# Patient Record
Sex: Female | Born: 1935 | Race: White | Hispanic: No | Smoking: Former smoker
Health system: Southern US, Community
[De-identification: ages and names within clinical notes are randomized; demographics above are authoritative.]

## PROBLEM LIST (undated history)

## (undated) DIAGNOSIS — J189 Pneumonia, unspecified organism: Secondary | ICD-10-CM

## (undated) DIAGNOSIS — I10 Essential (primary) hypertension: Secondary | ICD-10-CM

## (undated) DIAGNOSIS — J439 Emphysema, unspecified: Secondary | ICD-10-CM

## (undated) HISTORY — PX: HERNIA REPAIR: SHX51

## (undated) HISTORY — PX: ABDOMINAL HYSTERECTOMY: SHX81

## (undated) HISTORY — PX: ELBOW SURGERY: SHX618

---

## 1997-10-25 ENCOUNTER — Ambulatory Visit (HOSPITAL_COMMUNITY): Admission: RE | Admit: 1997-10-25 | Discharge: 1997-10-25 | Payer: Self-pay | Admitting: Internal Medicine

## 1998-11-09 ENCOUNTER — Encounter: Payer: Self-pay | Admitting: Internal Medicine

## 1998-11-09 ENCOUNTER — Ambulatory Visit (HOSPITAL_COMMUNITY): Admission: RE | Admit: 1998-11-09 | Discharge: 1998-11-09 | Payer: Self-pay | Admitting: Internal Medicine

## 1999-12-16 ENCOUNTER — Encounter: Payer: Self-pay | Admitting: Internal Medicine

## 1999-12-16 ENCOUNTER — Ambulatory Visit (HOSPITAL_COMMUNITY): Admission: RE | Admit: 1999-12-16 | Discharge: 1999-12-16 | Payer: Self-pay | Admitting: Internal Medicine

## 2001-02-04 ENCOUNTER — Encounter: Payer: Self-pay | Admitting: Internal Medicine

## 2001-02-04 ENCOUNTER — Ambulatory Visit (HOSPITAL_COMMUNITY): Admission: RE | Admit: 2001-02-04 | Discharge: 2001-02-04 | Payer: Self-pay | Admitting: Internal Medicine

## 2001-12-30 ENCOUNTER — Ambulatory Visit (HOSPITAL_COMMUNITY): Admission: RE | Admit: 2001-12-30 | Discharge: 2001-12-30 | Payer: Self-pay | Admitting: General Surgery

## 2002-03-17 ENCOUNTER — Encounter: Payer: Self-pay | Admitting: Internal Medicine

## 2002-03-17 ENCOUNTER — Ambulatory Visit (HOSPITAL_COMMUNITY): Admission: RE | Admit: 2002-03-17 | Discharge: 2002-03-17 | Payer: Self-pay | Admitting: Internal Medicine

## 2002-06-22 ENCOUNTER — Encounter: Admission: RE | Admit: 2002-06-22 | Discharge: 2002-08-09 | Payer: Self-pay | Admitting: *Deleted

## 2002-09-30 ENCOUNTER — Ambulatory Visit (HOSPITAL_COMMUNITY): Admission: RE | Admit: 2002-09-30 | Discharge: 2002-09-30 | Payer: Self-pay | Admitting: General Surgery

## 2002-09-30 ENCOUNTER — Encounter: Payer: Self-pay | Admitting: General Surgery

## 2002-11-14 ENCOUNTER — Ambulatory Visit (HOSPITAL_COMMUNITY): Admission: RE | Admit: 2002-11-14 | Discharge: 2002-11-14 | Payer: Self-pay | Admitting: Internal Medicine

## 2002-11-14 ENCOUNTER — Encounter: Payer: Self-pay | Admitting: Internal Medicine

## 2003-03-02 ENCOUNTER — Encounter: Admission: RE | Admit: 2003-03-02 | Discharge: 2003-04-06 | Payer: Self-pay | Admitting: *Deleted

## 2003-06-21 ENCOUNTER — Ambulatory Visit (HOSPITAL_COMMUNITY): Admission: RE | Admit: 2003-06-21 | Discharge: 2003-06-21 | Payer: Self-pay | Admitting: Internal Medicine

## 2004-05-03 ENCOUNTER — Ambulatory Visit (HOSPITAL_COMMUNITY): Admission: RE | Admit: 2004-05-03 | Discharge: 2004-05-03 | Payer: Self-pay | Admitting: Internal Medicine

## 2004-05-15 ENCOUNTER — Ambulatory Visit (HOSPITAL_COMMUNITY): Admission: RE | Admit: 2004-05-15 | Discharge: 2004-05-15 | Payer: Self-pay | Admitting: Internal Medicine

## 2004-06-04 ENCOUNTER — Encounter: Admission: RE | Admit: 2004-06-04 | Discharge: 2004-06-04 | Payer: Self-pay | Admitting: Internal Medicine

## 2004-08-07 ENCOUNTER — Encounter: Admission: RE | Admit: 2004-08-07 | Discharge: 2004-08-07 | Payer: Self-pay | Admitting: Internal Medicine

## 2005-09-02 ENCOUNTER — Ambulatory Visit (HOSPITAL_COMMUNITY): Admission: RE | Admit: 2005-09-02 | Discharge: 2005-09-02 | Payer: Self-pay | Admitting: Internal Medicine

## 2006-09-26 IMAGING — CR DG CHEST 2V
2 series · 2 of 2 positions shown · non-contrast
Comparison: Report dated 11/14/2002.

CLINICAL DATA: History of smoking, COPD and asthma. Wheezing.

CHEST - 2 VIEW

[view not recorded (1 of 2)]
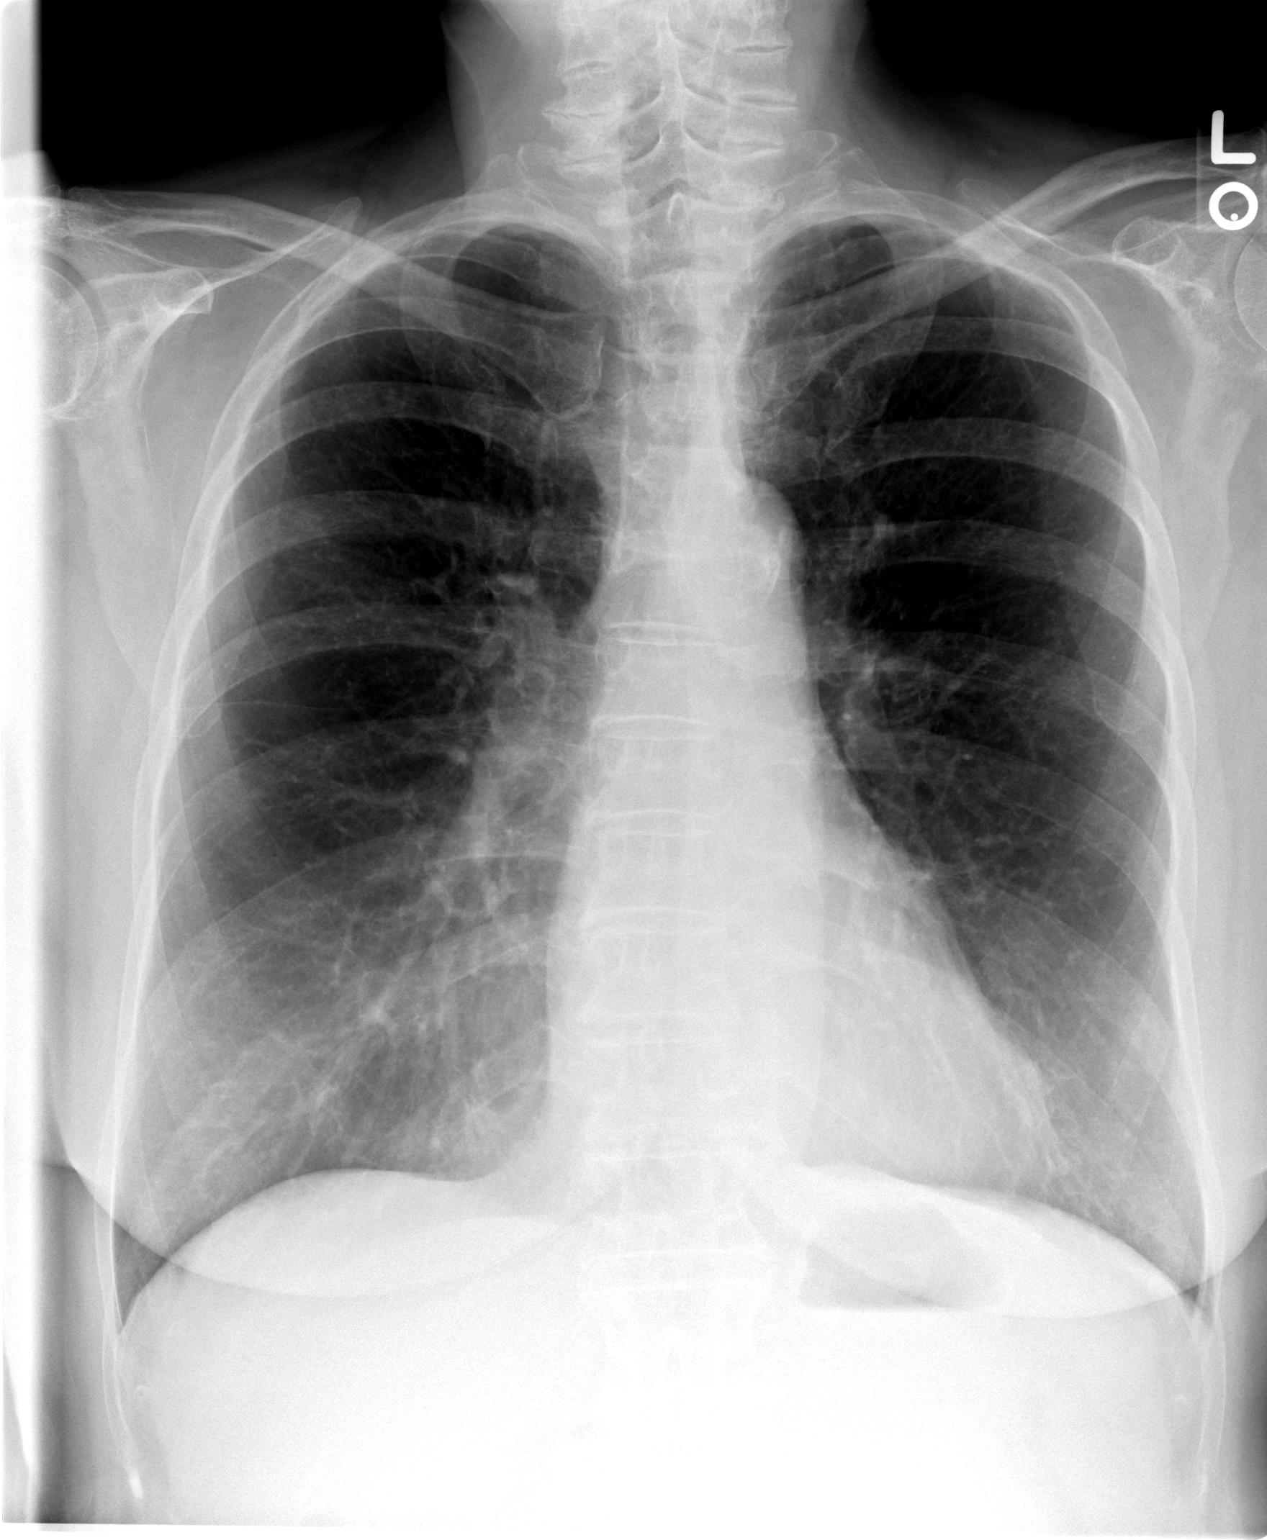

[view not recorded (2 of 2)]
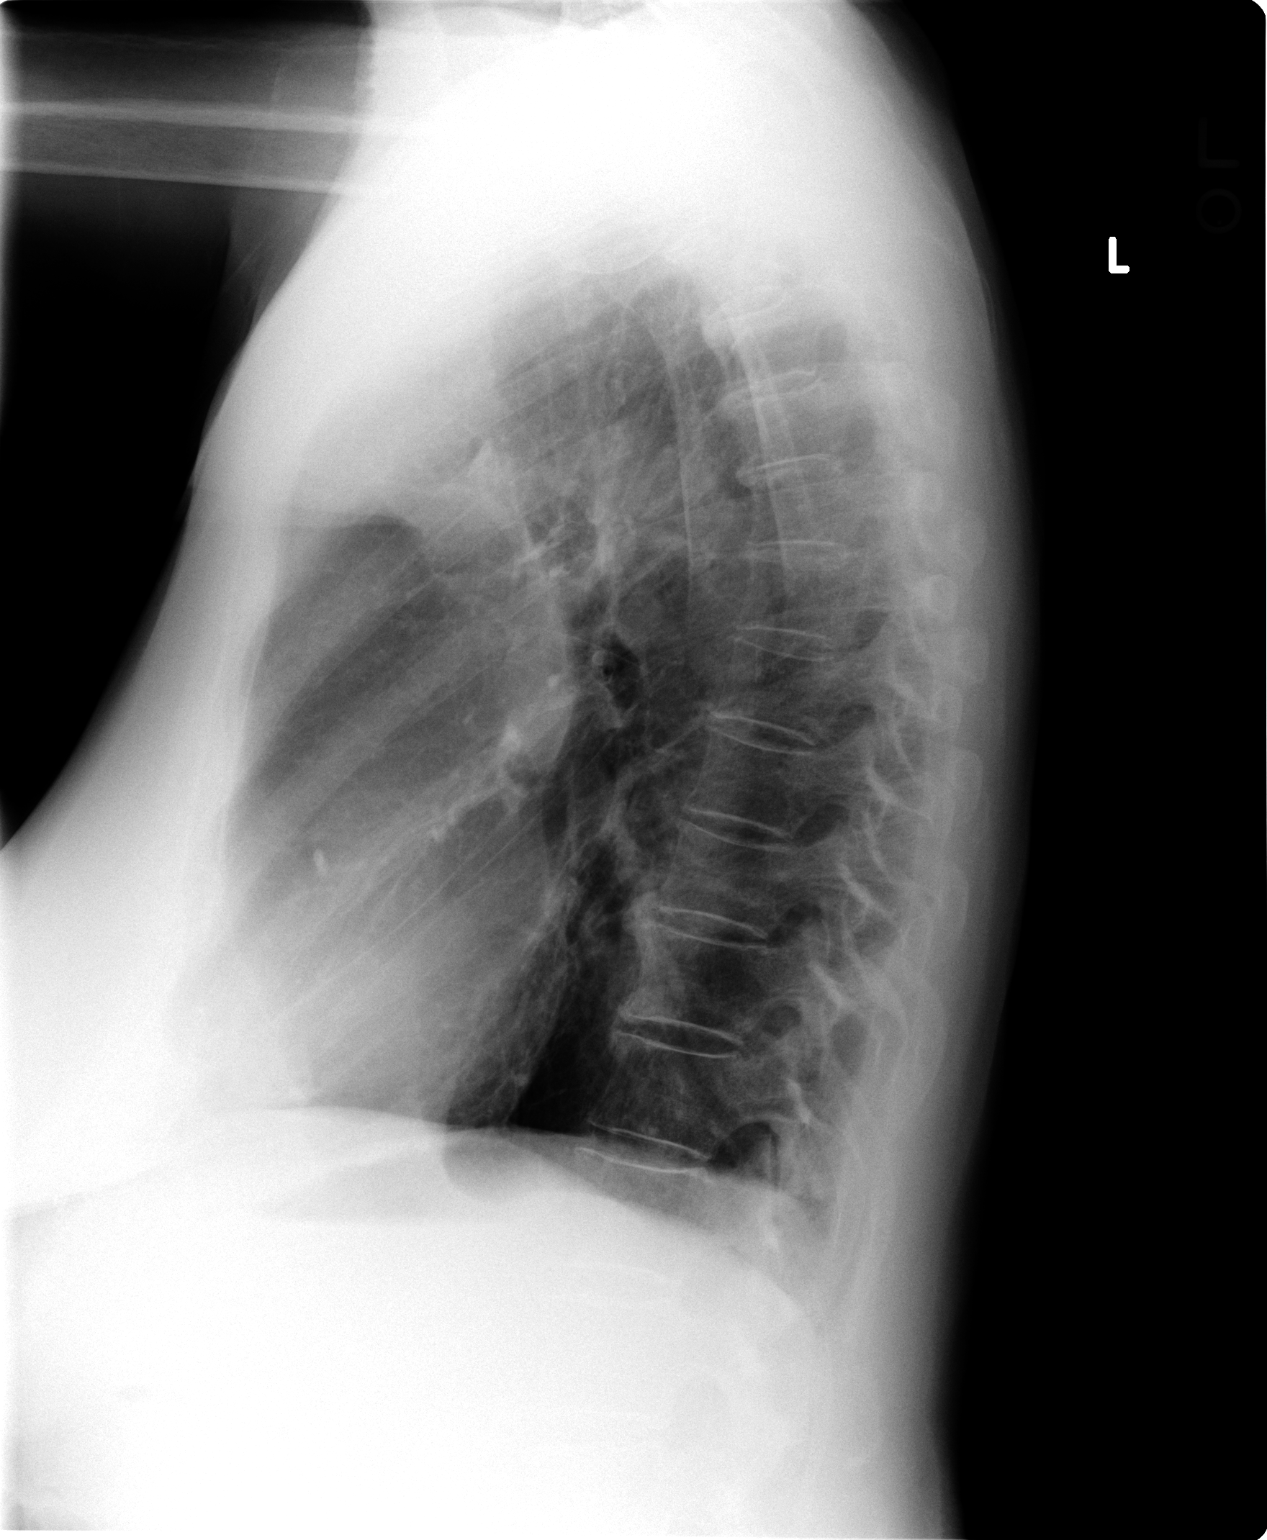

[2 of 2 positions shown; findings below may reference images not displayed]

FINDINGS: Normal sized heart. Mild to moderate changes of COPD. Mild scoliosis
and mild thoracic spine degenerative changes. Diffuse osteopenia.

IMPRESSION

Mild to moderate changes of COPD.

## 2006-10-14 ENCOUNTER — Ambulatory Visit (HOSPITAL_COMMUNITY): Admission: RE | Admit: 2006-10-14 | Discharge: 2006-10-14 | Payer: Self-pay | Admitting: Internal Medicine

## 2007-12-21 ENCOUNTER — Ambulatory Visit (HOSPITAL_COMMUNITY): Admission: RE | Admit: 2007-12-21 | Discharge: 2007-12-21 | Payer: Self-pay | Admitting: Internal Medicine

## 2007-12-30 ENCOUNTER — Encounter: Admission: RE | Admit: 2007-12-30 | Discharge: 2007-12-30 | Payer: Self-pay | Admitting: Internal Medicine

## 2009-02-13 ENCOUNTER — Encounter: Admission: RE | Admit: 2009-02-13 | Discharge: 2009-02-13 | Payer: Self-pay | Admitting: Internal Medicine

## 2009-06-06 ENCOUNTER — Encounter: Admission: RE | Admit: 2009-06-06 | Discharge: 2009-06-06 | Payer: Self-pay | Admitting: Internal Medicine

## 2009-08-02 ENCOUNTER — Encounter: Payer: Self-pay | Admitting: Critical Care Medicine

## 2009-10-01 DIAGNOSIS — E785 Hyperlipidemia, unspecified: Secondary | ICD-10-CM | POA: Insufficient documentation

## 2009-10-01 DIAGNOSIS — I872 Venous insufficiency (chronic) (peripheral): Secondary | ICD-10-CM | POA: Insufficient documentation

## 2009-10-01 DIAGNOSIS — J449 Chronic obstructive pulmonary disease, unspecified: Secondary | ICD-10-CM

## 2009-10-01 DIAGNOSIS — Z87898 Personal history of other specified conditions: Secondary | ICD-10-CM | POA: Insufficient documentation

## 2009-10-01 DIAGNOSIS — I10 Essential (primary) hypertension: Secondary | ICD-10-CM | POA: Insufficient documentation

## 2009-10-02 ENCOUNTER — Telehealth (INDEPENDENT_AMBULATORY_CARE_PROVIDER_SITE_OTHER): Payer: Self-pay | Admitting: *Deleted

## 2009-10-02 ENCOUNTER — Ambulatory Visit: Payer: Self-pay | Admitting: Critical Care Medicine

## 2009-10-02 DIAGNOSIS — G250 Essential tremor: Secondary | ICD-10-CM

## 2009-10-02 DIAGNOSIS — G252 Other specified forms of tremor: Secondary | ICD-10-CM

## 2009-10-04 ENCOUNTER — Encounter: Payer: Self-pay | Admitting: Critical Care Medicine

## 2009-10-16 ENCOUNTER — Encounter: Payer: Self-pay | Admitting: Critical Care Medicine

## 2009-10-26 ENCOUNTER — Telehealth (INDEPENDENT_AMBULATORY_CARE_PROVIDER_SITE_OTHER): Payer: Self-pay | Admitting: *Deleted

## 2009-10-31 ENCOUNTER — Encounter: Payer: Self-pay | Admitting: Critical Care Medicine

## 2009-11-06 ENCOUNTER — Encounter: Payer: Self-pay | Admitting: Critical Care Medicine

## 2009-12-05 ENCOUNTER — Ambulatory Visit: Payer: Self-pay | Admitting: Critical Care Medicine

## 2009-12-06 ENCOUNTER — Telehealth: Payer: Self-pay | Admitting: Critical Care Medicine

## 2009-12-20 ENCOUNTER — Telehealth (INDEPENDENT_AMBULATORY_CARE_PROVIDER_SITE_OTHER): Payer: Self-pay | Admitting: *Deleted

## 2009-12-20 ENCOUNTER — Encounter: Payer: Self-pay | Admitting: Critical Care Medicine

## 2009-12-21 ENCOUNTER — Telehealth (INDEPENDENT_AMBULATORY_CARE_PROVIDER_SITE_OTHER): Payer: Self-pay | Admitting: *Deleted

## 2010-06-09 ENCOUNTER — Encounter: Payer: Self-pay | Admitting: Internal Medicine

## 2010-06-20 NOTE — Miscellaneous (Signed)
Summary: ONO on RA  Clinical Lists Changes  Observations: Added new observation of SLEEP STUDY: Low Oxygen Sat: 83% Oximetry overnight on          RA        =   94% ,  most of sats >88%. suspect desats are artifacts  (12/11/2009 14:35)      Sleep Study  Procedure date:  12/11/2009  Findings:      Low Oxygen Sat: 83% Oximetry overnight on          RA        =   94% ,  most of sats >88%. suspect desats are artifacts

## 2010-06-20 NOTE — Assessment & Plan Note (Signed)
Summary: Pulmonary OV   Copy to:  Dr. Lucky [name] Primary Provider/Referring Provider:  Dr. Lucky [name]  CC:  Follow up to discuss oxygen.  Pt states she believes o2 was not helping.  States she is waking up much earlier and not sleeping as well with the oxygen.  Pt states overall breathing is doing better during the day.  States she "seldom" has a cough and does c/o hoarseness.  Denies wheezing and chest tightness.  Pt has moved to [place] [employer] [facility] since last OV.Marland Kitchen  History of Present Illness: Pulmonary OV       This is an 77 year old woman with COPD Golds II This pt is on atrovent and budesonide in nebulizer two times a day.   Also on spiriva and qvar two times a day. [facility] is home DME co. Pt has not been through pulm rehab.   Pt notes side effects with albuterol neb med.   Copd dx 2005,  no smoking since 2005.  This pt is dypsneic with exertion.  Notes lack of energy or endurance.  Had prior  been on azmacort/spiriva.  She has daily cough.   Pt clears throat alot and notes some sore throat, she is on an ACE inhiibitor   December 05, 2009 12:14 PM Pt is hoarse on qvar and spiriva.  Pt also not sure oxygen has been helpful.   Pt denies any significant sore throat, nasal congestion or excess secretions, fever, chills, sweats, unintended weight loss, pleurtic or exertional chest pain, orthopnea PND, or leg swelling Pt denies any increase in rescue therapy over baseline, denies waking up needing it or having any early am or nocturnal exacerbations of coughing/wheezing/or dyspnea. Pt queries if will need oxygen.  Preventive Screening-Counseling & Management  Alcohol-Tobacco     Smoking Status: quit     Packs/Day: <0.25     Year Quit: 2005     Pack years: 69  Current Medications (verified): 1)  Pravastatin Sodium 40 Mg Tabs (Pravastatin Sodium) .... 1/4 of Tab Once Daily 2)  Hydrochlorothiazide 25 Mg Tabs (Hydrochlorothiazide) .... Once Daily 3)  Benicar 20 Mg  Tabs (Olmesartan  Medoxomil) .... One Tablet By Mouth Daily 4)  Aspir-Low 81 Mg Tbec (Aspirin) .... 3-4 Times Weekly 5)  Guaifenesin 400 Mg Tabs (Guaifenesin) .... As Needed 6)  Vitamin D 1000 Unit Tabs (Cholecalciferol) .... 2 Tabs Once Daily 7)  Lutein 6 Mg Caps (Lutein) .... 2 Caps Once Daily 8)  Qvar 40 Mcg/act  Aers (Beclomethasone Dipropionate) .... Two Puffs Twice Daily 9)  Spiriva Handihaler 18 Mcg Caps (Tiotropium Bromide Monohydrate) .... Once Daily 10)  Xalatan 0.005 % Soln (Latanoprost) .Marland Kitchen.. 1 Gtt At Bedtime Right Eye 11)  Alprazolam 0.5 Mg Tbdp (Alprazolam) .... One By Mouth Qhs 12)  Magnesium 500 Mg Tabs (Magnesium) .... 2-3 Times Weekly 13)  Brovana 15 Mcg/58ml  Nebu (Arformoterol Tartrate) .... One in Nebulizer Twice Daily 14)  Complete Womens  Tabs (Multiple Vitamins-Minerals) .... Take 1 Tablet By Mouth Once A Day  Allergies (verified): 1)  ! * Zebeta 2)  Erythromycin 3)  Verapamil 4)  Pcn 5)  Cipro 6)  * Shellfish  Past History:  Past medical, surgical, family and social histories (including risk factors) reviewed, and no changes noted (except as noted below).  Past Medical History: Reviewed history from 10/02/2009 and no changes required. TREMOR, ESSENTIAL (ICD-333.1) ASTHMA (ICD-493.90) POSITIVE PPD (ICD-795.5) FIBROCYSTIC BREAST DISEASE, HX OF (ICD-V13.9) DYSLIPIDEMIA (ICD-272.4) SHINGLES, HX OF (ICD-V13.8) COPD (ICD-496) VENOUS INSUFFICIENCY (ICD-459.81)  HYPERTENSION (ICD-401.9)  Past Surgical History: Reviewed history from 10/01/2009 and no changes required. Appendectomy 1947 D&C 1952 hysterectomy and left oophorectomy 1991 repair of right inguinal hernia and right femoral hernia 2003  Family History: Reviewed history from 10/02/2009 and no changes required. Father-colon ca, MI, rheumatism mother-stroke brother-prostate ca sister-abdominal ca  Social History: Reviewed history from 10/02/2009 and no changes required. 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  Quit in late 1990's.  1/2ppd x 39yrs  Review of Systems       The patient complains of shortness of breath with activity and non-productive cough.  The patient denies shortness of breath at rest, productive cough, coughing up blood, chest pain, irregular heartbeats, acid heartburn, indigestion, loss of appetite, weight change, abdominal pain, difficulty swallowing, sore throat, tooth/dental problems, headaches, nasal congestion/difficulty breathing through nose, sneezing, itching, ear ache, anxiety, depression, hand/feet swelling, joint stiffness or pain, rash, change in color of mucus, and fever.    Vital Signs:  Patient profile:   77 year old female Height:      65.5 inches Weight:      148.25 pounds BMI:     24.38 O2 Sat:      97 % on Room air Temp:     98.0 degrees F oral Pulse rate:   70 / minute BP sitting:   126 / 78  (right arm) Cuff size:   regular  Vitals Entered By: Tonya Dimitri RN (December 05, 2009 12:09 PM)  O2 Flow:  Room air  Clinical Reports Reviewed:  PFT's:  12/05/2009: FEV1 %Predicted:  33.30 FEV1/FVC %Predicted:  72.80 FVC %Predicted:  46.40  10/02/2009: FEV1 %Predicted:  33.70 FEV1/FVC %Predicted:  63.90 FVC %Predicted:  53.50  CXR:  06/06/2009: CXR Results:  No active disease, COPD changes  CC: Follow up to discuss oxygen.  Pt states she believes o2 was not helping.  States she is waking up much earlier and not sleeping as well with the oxygen.  Pt states overall breathing is doing better during the day.  States she "seldom" has a cough and does c/o hoarseness.  Denies wheezing and chest tightness.  Pt has moved to [place] [employer] [facility] since last OV. Comments Medications reviewed with patient Daytime contact number verified with patient. Tonya Dimitri RN  December 05, 2009 12:04 PM    Physical Exam  General:  normal appearance.   Nose:  clear nasal discharge.   Mouth:  no  deformity or lesions Neck:  no masses, thyromegaly, or abnormal cervical nodes Chest Wall:  no deformities noted Lungs:  decreased BS bilateral and prolonged exhilation.   Heart:  regular rate and rhythm, S1, S2 without murmurs, rubs, gallops, or clicks Abdomen:  bowel sounds positive; abdomen soft and non-tender without masses, or organomegaly Msk:  no deformity or scoliosis noted with normal posture Pulses:  pulses normal Extremities:  no clubbing, cyanosis, edema, or deformity noted Neurologic:  CN II-XII grossly intact with normal reflexes, coordination, muscle strength and tone Skin:  intact without lesions or rashes Cervical Nodes:  no significant adenopathy Axillary Nodes:  no significant adenopathy Psych:  alert and cooperative; normal mood and affect; normal attention span and concentration   Pulmonary Function Test Date: 12/05/2009 12:33 AM Gender: Female  Pre-Spirometry FVC    Value: 1.19 L/min   % Pred: 46.40 % FEV1    Value: 0.63 L     Pred: 1.89 L     %  Pred: 33.30 % FEV1/FVC  Value: 53.12 %     % Pred: 72.80 %  Impression & Recommendations:  Problem # 1:  COPD (ICD-496)  Moderate COPD Golds stage III adverse side effect from ICS, doubt spiriva an issue. ? if oxygen beneficial plan stop qvar change benicar to generic losartan stay on spiriva and brovana ONO on RA repeat Note spirometry unchanged from prior  Medications Added to Medication List This Visit: 1)  Losartan Potassium 50 Mg Tabs (Losartan potassium) .... One by mouth daily 2)  Complete Womens Tabs (Multiple vitamins-minerals) .... Take 1 tablet by mouth once a day  Complete Medication List: 1)  Pravastatin Sodium 40 Mg Tabs (Pravastatin sodium) .... 1/4 of tab once daily 2)  Hydrochlorothiazide 25 Mg Tabs (Hydrochlorothiazide) .... Once daily 3)  Losartan Potassium 50 Mg Tabs (Losartan potassium) .... One by mouth daily 4)  Aspir-low 81 Mg Tbec (Aspirin) .... 3-4 times weekly 5)  Guaifenesin 400  Mg Tabs (Guaifenesin) .... As needed 6)  Vitamin D 1000 Unit Tabs (Cholecalciferol) .... 2 tabs once daily 7)  Lutein 6 Mg Caps (Lutein) .... 2 caps once daily 8)  Spiriva Handihaler 18 Mcg Caps (Tiotropium bromide monohydrate) .... Once daily 9)  Xalatan 0.005 % Soln (Latanoprost) .Marland Kitchen.. 1 gtt at bedtime right eye 10)  Alprazolam 0.5 Mg Tbdp (Alprazolam) .... One by mouth qhs 11)  Magnesium 500 Mg Tabs (Magnesium) .... 2-3 times weekly 12)  Brovana 15 Mcg/50ml Nebu (Arformoterol tartrate) .... One in nebulizer twice daily 13)  Complete Womens Tabs (Multiple vitamins-minerals) .... Take 1 tablet by mouth once a day  Other Orders: Est. Patient Level IV (16109) DME Referral (DME)  Patient Instructions: 1)  A repeat overnight sleep oximetry will be obtained on room air 2)  Switch from Benicar to losartan one daily 50mg  . see script 3)  Brovana refills were sent in May, stay on Brovana 4)  Stay on spiriva 5)  Stop Qvar 6)  Return as needed  Prescriptions: LOSARTAN POTASSIUM 50 MG TABS (LOSARTAN POTASSIUM) one by mouth daily  #30 x 6   Entered and Authorized by:   Storm Frisk MD   Signed by:   Storm Frisk MD on 12/05/2009   Method used:   Print then Give to Patient   RxID:   [id number]    CardioPerfect Spirometry  ID: [id number] Patient: Tonya [name] DOB: 1923-03-02 Age: 77 Years Old Sex: Female Race: White Physician: Delford Field Height: 65.5 Weight: 148.25 PPD: <0.25 Status: Unconfirmed Past Medical History:  TREMOR, ESSENTIAL (ICD-333.1) ASTHMA (ICD-493.90) POSITIVE PPD (ICD-795.5) FIBROCYSTIC BREAST DISEASE, HX OF (ICD-V13.9) DYSLIPIDEMIA (ICD-272.4) SHINGLES, HX OF (ICD-V13.8) COPD (ICD-496) VENOUS INSUFFICIENCY (ICD-459.81) HYPERTENSION (ICD-401.9)    Recorded: 12/05/2009 12:33 AM  Parameter  Measured Predicted %Predicted FVC     1.19        2.55        46.40 FEV1     0.63        1.89        33.30 FEV1%   53.12        72.96        72.80 PEF     1.57        4.54        34.60   Interpretation:   Appended Document: Pulmonary OV fax Tonya [name]

## 2010-06-20 NOTE — Progress Notes (Signed)
Summary: CT (incorrect info)  Phone Note Call from Patient Call back at Home Phone [phone]   Caller: Patient Call For: Stephen Turnbaugh Reason for Call: Insurance Question Summary of Call: pt says she was given a referral for a CT yesterday at her ov, but it has the wrong pt's name on it. (name reads: Georgiana Shore so that pt may not have the correct info either). call pt at home.  Initial call taken by: Tivis Ringer, CNA,  December 06, 2009 12:43 PM  Follow-up for Phone Call        crystal i looked in this pts chart at the last ov---i didnt see an order or anything in there about a ct for her.  please advise.  thanks Randell Loop CMA  December 06, 2009 3:34 PM   There are no orders in the pt's chart for CT and nothing mentioned in her instructions for CT.  Sounds like she was just given a CT referral paper on someone else in error.  Gweneth Dimitri RN  December 06, 2009 3:48 PM   Additional Follow-up for Phone Call Additional follow up Details #1::        [email or web address] Randell Loop CMA  December 06, 2009 4:08 PM     pt returned my call and is aware that the paper was given to her by mistake and to disregard that paper.  pt voiced her understanding Randell Loop [facility]  December 06, 2009 4:41 PM     Additional Follow-up for Phone Call Additional follow up Details #2::    this is correct  I did NOT order a CT chest on this pt  she was handed someone else's paperwork Follow-up by: Storm Frisk MD,  December 06, 2009 4:09 PM

## 2010-06-20 NOTE — Progress Notes (Signed)
Summary: oxygen  Phone Note Call from Patient Call back at [facility] Phone [phone]   Caller: Patient Call For: wright Summary of Call: Wants to d/c oxygen, pls advise. Initial call taken by: Darletta Moll,  October 26, 2009 1:34 PM  Follow-up for Phone Call        PW-Pt states she ahs been having nose bleeds since starting the oxygen at night and her fatigue is much worse. She states she is not sleeping well, where as before she slept fine. She is requesting to have oxygen d/c. I advsied that according to her ONO she needed the oxygen at night. She states she understands this but really just wants the oxygen picked up. Please advise. Carron Curie CMA  October 26, 2009 1:57 PM   Additional Follow-up for Phone Call Additional follow up Details #1::        have dme put humidity bottle on oxygen, and have her use ayr nasal saline GEL in nostril just as she is going to bed.  She needs to discuss d/c oxygen with Dr. Delford Field when he gets back in town. Additional Follow-up by: Barbaraann Share MD,  October 26, 2009 5:19 PM    Additional Follow-up for Phone Call Additional follow up Details #2::    Pt advised of KC recs, pt states she already had DME company come out on Monday to add same and she believes she has tried ayr before. I asked pt to check with daughter to see if it is the same medication and not to try same. Pt informed PW is out of the office and she is okay to wait until he returns. Zackery Barefoot CMA  October 26, 2009 5:37 PM   Offered pt to come in to see TP pt refused and advised she wants to wait for PW to get back with his recs. Zackery Barefoot CMA  October 26, 2009 5:44 PM   Additional Follow-up for Phone Call Additional follow up Details #3:: Details for Additional Follow-up Action Taken: sched ov with me to discuss before pickup    Spoke with pt and sched appt for her to see PW on 11/21/09 at 10 am. Vernie Murders  October 30, 2009 11:15 AM  Additional Follow-up by: Storm Frisk  MD,  October 30, 2009 11:08 AM

## 2010-06-20 NOTE — Procedures (Signed)
Summary: [facility]  [facility]   Imported By: Lester Plains 10/24/2009 08:43:43  _____________________________________________________________________  External Attachment:    Type:   Image     Comment:   External Document

## 2010-06-20 NOTE — Miscellaneous (Signed)
Summary: ONO RA  Clinical Lists Changes  Observations: Added new observation of SLEEP STUDY: Low Oxygen Sat: 82% Oximetry overnight on      RA            =  82% for 3 minutes  (10/10/2009 11:43)      Sleep Study  Procedure date:  10/10/2009  Findings:      Low Oxygen Sat: 82% Oximetry overnight on      RA            =  82% for 3 minutes   Appended Document: ONO RA result noted  patient aware

## 2010-06-20 NOTE — Progress Notes (Signed)
Summary: oxygen therapy  Phone Note From Other Clinic Call back at [phone]   Caller: [facility]  anita Call For: wright Summary of Call: question about pt discontinuing oxygen therapy.+ Initial call taken by: Rickard Patience,  December 21, 2009 10:25 AM  Follow-up for Phone Call        Spoke with Synetta Fail.  She states that she never recieved order to d/c o2 so I refaxed order to her attn at [phone].  Follow-up by: Vernie Murders,  December 21, 2009 10:52 AM

## 2010-06-20 NOTE — Letter (Signed)
Summary: CMN for Oxygen/[facility]  CMN for Oxygen/[facility]   Imported By: Sherian Rein 11/06/2009 13:09:46  _____________________________________________________________________  External Attachment:    Type:   Image     Comment:   External Document

## 2010-06-20 NOTE — Letter (Signed)
Summary: CMN/[facility]  CMN/[facility]   Imported By: Lester Monte[place]ello 11/20/2009 08:32:01  _____________________________________________________________________  External Attachment:    Type:   Image     Comment:   External Document

## 2010-06-20 NOTE — Progress Notes (Signed)
Summary: medication question-ATC BUSY x 3  Phone Note Call from Patient Call back at Home Phone [phone]   Caller: Daughter//laura dudley Call For: Delford Field Summary of Call: Had ov today wants to know if her mom should continue budesonide 0.25mg /29ml, pls advise. Initial call taken by: Darletta Moll,  Oct 02, 2009 3:21 PM  Follow-up for Phone Call        pt was just seen by PW today for a consult.  Daughter is calling regarding changes to pt's medications.  #1) Daughter states pt didnt' tell her she has been out of her Budesonide nebs for several days now and needs rx sent to pharamcy for this.  Please advise if you are ok with sending rx for Budesonide.  #2) Daughter states pt forgot to inform PW that she was already put on Qvar 80mg  2 puffs once daily by another physician and PW prescribed Qvar 40 2 puffs bid.   Which one does PW prefer?    FYI,   I did  inform daughter to avoid these confusions at  future appts  with her meds that it's very important for pt to  bring all her current medications she is taking to each visit so that PW will know exactly what she is on.  daughter verbalized understanding and agreed.    Will forward message to PW to address.  Arman Filter LPN  Oct 02, 2009 3:39 PM   Additional Follow-up for Phone Call Additional follow up Details #1::        Actually,  stay off Budesonide for now Stay on Qvar, this is same medication Start Brovana in nebulizer as prescribed I removed budesonide from med list Additional Follow-up by: Storm Frisk MD,  Oct 02, 2009 3:59 PM    Additional Follow-up for Phone Call Additional follow up Details #2::    ATC [name] x 2 line was busy WCB Vernie Murders  Oct 02, 2009 4:12 PM  ATC line still busy, WCB Vernie Murders  Oct 02, 2009 4:22 PM ATC line still busy, WCB Vernie Murders  Oct 02, 2009 4:57 PM   Additional Follow-up for Phone Call Additional follow up Details #3:: Details for Additional Follow-up Action Taken:  Spoke with pt's daughter and made aware of recs per Dr. Delford Field.  Pt's daughter verbalized understanding.  Additional Follow-up by: Vernie Murders,  Oct 02, 2009 5:20 PM

## 2010-06-20 NOTE — Progress Notes (Signed)
Summary: ONO test results  Phone Note Call from Patient   Caller: Patient Call For: wright Summary of Call: pt have not heard anything about home oxygen with [facility]. Initial call taken by: Rickard Patience,  December 20, 2009 1:37 PM  Follow-up for Phone Call        pt was recently seen by PW on 12-05-2009.  PW ordered ONO on RA.  Pt states she had test done last week and would like test results.  Will forward message to PW to address.  Aundra Millet Reynolds LPN  December 20, 2009 1:43 PM   Additional Follow-up for Phone Call Additional follow up Details #1::        no one home when I called call her and tell her ONO was normal we will d/c oxygen  Additional Follow-up by: Storm Frisk MD,  December 20, 2009 2:33 PM    Additional Follow-up for Phone Call Additional follow up Details #2::    called and spoke with pt.  pt aware of ONO results and that order has been sent to [facility] to d/c o2.  Pt also wanted to know if she needs to continue on brovana and Spiriva.  Informed pt,  per last ov with PW on 12-05-2009...he wanted her to stay on Spiriva and Brovana.  Pt verbalized understanding. nothing futher needed.  Aundra Millet Reynolds LPN  December 20, 2009 2:40 PM

## 2010-06-20 NOTE — Assessment & Plan Note (Signed)
Summary: Pulmonary Consultation   Copy to:  Dr. Lucky Cowboy Primary Provider/Referring Provider:  Dr. Lucky Cowboy  CC:  Pumonary Consult for COPD. and COPD initial evaluation.  History of Present Illness: Pulmonary Consultation       This is an 77 year old woman who presents for COPD initial evaluation.  The patient complains of history of diagnosed COPD, shortness of breath, chest tightness, wheezing, cough, and mucous production, but denies chest pain worse with breathing and coughing, nocturnal awakening, exercise induced symptoms, and congestion.  Prior evaluation and testing has included Cxr.  The dyspnea is described as with ADL's, with slow ADL's, with walking within room, with walking one or two blocks, and with walking stairs.  Oxygen evaluation is described as not on supplemental O2.  Prior effective treatment has included inhaled corticosteroids.    This pt is on atrovent and budesonide in nebulizer two times a day.   Also on spiriva and qvar two times a day. [facility] is home DME co. Pt has not been through pulm rehab.   Pt notes side effects with albuterol neb med.   Copd dx 2005,  no smoking since 2005.  This pt is dypsneic with exertion.  Notes lack of energy or endurance.  Had prior  been on azmacort/spiriva.  She has daily cough.   Pt clears throat alot and notes some sore throat, she is on an ACE inhiibitor    Preventive Screening-Counseling & Management  Alcohol-Tobacco     Smoking Status: quit     Packs/Day: <0.25     Year Quit: 2005     Pack years: 60  Current Medications (verified): 1)  Pravastatin Sodium 40 Mg Tabs (Pravastatin Sodium) .... 1/4 of Tab Once Daily 2)  Hydrochlorothiazide 25 Mg Tabs (Hydrochlorothiazide) .... Once Daily 3)  Enalapril Maleate 20 Mg Tabs (Enalapril Maleate) .... Once Daily 4)  Aspir-Low 81 Mg Tbec (Aspirin) .... 3-4 Times Weekly 5)  Guaifenesin 400 Mg Tabs (Guaifenesin) .... As Needed 6)  Vitamin D 1000 Unit Tabs  (Cholecalciferol) .... 2 Tabs Once Daily 7)  Lutein 6 Mg Caps (Lutein) .... 2 Caps Once Daily 8)  Qvar ?strength .... 2 Puffs Once Daily 9)  Spiriva Handihaler 18 Mcg Caps (Tiotropium Bromide Monohydrate) .... Once Daily 10)  Xalatan 0.005 % Soln (Latanoprost) .Marland Kitchen.. 1 Gtt At Bedtime Right Eye 11)  Ipratropium Bromide 0.02 % Soln (Ipratropium Bromide) .Marland Kitchen.. 1 Vial Two Times A Day 12)  Budesonide 0.25 Mg/25ml Susp (Budesonide) .Marland Kitchen.. 1 Vial Once Daily  Allergies (verified): 1)  ! * Zebeta 2)  Erythromycin 3)  Verapamil 4)  Pcn 5)  Cipro 6)  * Shellfish  Past History:  Past medical, surgical, family and social histories (including risk factors) reviewed, and no changes noted (except as noted below).  Past Medical History: TREMOR, ESSENTIAL (ICD-333.1) ASTHMA (ICD-493.90) POSITIVE PPD (ICD-795.5) FIBROCYSTIC BREAST DISEASE, HX OF (ICD-V13.9) DYSLIPIDEMIA (ICD-272.4) SHINGLES, HX OF (ICD-V13.8) COPD (ICD-496) VENOUS INSUFFICIENCY (ICD-459.81) HYPERTENSION (ICD-401.9)  Past Surgical History: Reviewed history from 10/01/2009 and no changes required. Appendectomy 1947 D&C 1952 hysterectomy and left oophorectomy 1991 repair of right inguinal hernia and right femoral hernia 2003  Family History: Reviewed history from 10/01/2009 and no changes required. Father-colon ca, MI, rheumatism mother-stroke brother-prostate ca sister-abdominal ca  Social History: Reviewed history from 10/01/2009 and no changes required. 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  Quit in late 1990's.  1/2ppd x 56yrs  Smoking Status:  quit Packs/Day:  <0.25 Pack years:  50  Review of Systems       The patient complains of shortness of breath with activity, shortness of breath at rest, productive cough, non-productive cough, indigestion, abdominal pain, sore throat, tooth/dental problems, nasal congestion/difficulty breathing  through nose, sneezing, itching, anxiety, depression, hand/feet swelling, joint stiffness or pain, and rash.  The patient denies coughing up blood, chest pain, irregular heartbeats, acid heartburn, loss of appetite, weight change, difficulty swallowing, headaches, ear ache, change in color of mucus, and fever.        See HPI for Pulmonary, Cardiac, General, and ENT review of systems.  Vital Signs:  Patient profile:   77 year old female Height:      65 inches Weight:      151 pounds BMI:     25.22 O2 Sat:      96 % on Room air Temp:     97.8 degrees F oral Pulse rate:   86 / minute BP sitting:   124 / 72  (right arm) Cuff size:   regular  Vitals Entered By: Gweneth Dimitri RN (Oct 02, 2009 1:49 PM)  O2 Flow:  Room air CC: Pumonary Consult for COPD., COPD initial evaluation Comments Medications reviewed with patient Daytime contact number verified with patient. Gweneth Dimitri RN  Oct 02, 2009 1:50 PM    Physical Exam  General:  normal appearance.   Nose:  clear nasal discharge.   Mouth:  no deformity or lesions Neck:  no masses, thyromegaly, or abnormal cervical nodes Chest Wall:  no deformities noted Lungs:  decreased BS bilateral and prolonged exhilation.   Heart:  regular rate and rhythm, S1, S2 without murmurs, rubs, gallops, or clicks Abdomen:  bowel sounds positive; abdomen soft and non-tender without masses, or organomegaly Msk:  no deformity or scoliosis noted with normal posture Pulses:  pulses normal Extremities:  no clubbing, cyanosis, edema, or deformity noted Neurologic:  CN II-XII grossly intact with normal reflexes, coordination, muscle strength and tone Skin:  intact without lesions or rashes Cervical Nodes:  no significant adenopathy Axillary Nodes:  no significant adenopathy Inguinal Nodes:  no significant adenopathy Psych:  alert and cooperative; normal mood and affect; normal attention span and concentration   Pulmonary Function Test Date: 10/02/2009 2:09  PM Gender: Female  Pre-Spirometry FVC    Value: 1.37 L/min   % Pred: 53.50 % FEV1    Value: 0.64 L     Pred: 1.89 L     % Pred: 33.70 % FEV1/FVC  Value: 46.61 %     % Pred: 63.90 %  Impression & Recommendations:  Problem # 1:  COPD (ICD-496) Assessment Unchanged Moderate COPD Golds stage III doubt oxygen dependent but needs to be checked.  would benefit from pulm rehab the ace inhibitor this pt is on may also ppt cough plan Continue spiriva D/c atrovent in nebs d/c budesonide in nebs start brovana in nebs two times a day cont qvar 40 but increase to  two puff two times a day (no need for budesonide if on qvar) pulm rehab referral start ARB benicar 20mg /d ONO on RA to eval overnite oxygenation stop enalapril   Medications Added to Medication List This Visit: 1)  Pravastatin Sodium 40 Mg Tabs (Pravastatin sodium) .... 1/4 of tab once daily 2)  Hydrochlorothiazide 25 Mg Tabs (Hydrochlorothiazide) .... Once daily 3)  Enalapril Maleate 20 Mg Tabs (Enalapril maleate) .... Once daily 4)  Benicar 20 Mg Tabs (  Olmesartan medoxomil) .... One tablet by mouth daily 5)  Aspir-low 81 Mg Tbec (Aspirin) .... 3-4 times weekly 6)  Guaifenesin 400 Mg Tabs (Guaifenesin) .... As needed 7)  Vitamin D 1000 Unit Tabs (Cholecalciferol) .... 2 tabs once daily 8)  Lutein 6 Mg Caps (Lutein) .... 2 caps once daily 9)  Qvar ?strength  .... 2 puffs once daily 10)  Qvar 40 Mcg/act Aers (Beclomethasone dipropionate) .... Two puffs twice daily 11)  Spiriva Handihaler 18 Mcg Caps (Tiotropium bromide monohydrate) .... Once daily 12)  Xalatan 0.005 % Soln (Latanoprost) .Marland Kitchen.. 1 gtt at bedtime right eye 13)  Ipratropium Bromide 0.02 % Soln (Ipratropium bromide) .Marland Kitchen.. 1 vial two times a day 14)  Budesonide 0.25 Mg/5ml Susp (Budesonide) .Marland Kitchen.. 1 vial once daily 15)  Alprazolam 0.5 Mg Tbdp (Alprazolam) .... One by mouth qhs 16)  Magnesium 500 Mg Tabs (Magnesium) .... 2-3 times weekly 17)  Brovana 15 Mcg/110ml Nebu  (Arformoterol tartrate) .... One in nebulizer twice daily  Complete Medication List: 1)  Pravastatin Sodium 40 Mg Tabs (Pravastatin sodium) .... 1/4 of tab once daily 2)  Hydrochlorothiazide 25 Mg Tabs (Hydrochlorothiazide) .... Once daily 3)  Benicar 20 Mg Tabs (Olmesartan medoxomil) .... One tablet by mouth daily 4)  Aspir-low 81 Mg Tbec (Aspirin) .... 3-4 times weekly 5)  Guaifenesin 400 Mg Tabs (Guaifenesin) .... As needed 6)  Vitamin D 1000 Unit Tabs (Cholecalciferol) .... 2 tabs once daily 7)  Lutein 6 Mg Caps (Lutein) .... 2 caps once daily 8)  Qvar 40 Mcg/act Aers (Beclomethasone dipropionate) .... Two puffs twice daily 9)  Spiriva Handihaler 18 Mcg Caps (Tiotropium bromide monohydrate) .... Once daily 10)  Xalatan 0.005 % Soln (Latanoprost) .Marland Kitchen.. 1 gtt at bedtime right eye 11)  Alprazolam 0.5 Mg Tbdp (Alprazolam) .... One by mouth qhs 12)  Magnesium 500 Mg Tabs (Magnesium) .... 2-3 times weekly 13)  Brovana 15 Mcg/72ml Nebu (Arformoterol tartrate) .... One in nebulizer twice daily  Other Orders: Spirometry w/Graph (94010) New Patient Level V (16109) Rehabilitation Referral (Rehab) DME Referral (DME)  Patient Instructions: 1)  Stop ipratroprium in nebulizer 2)  Start Brovana one in nebulzer two times a day 3)  Stay on Spiriva daily 4)  Increase Qvar two puff twice daily 5)  Stop enalapril 6)  Start Benicar one daily  7)  A referral to pulmonary rehab will be made 8)  An overnight sleep oximetry on room air will be obtained in the home 9)  Return 6 weeks Prescriptions: BENICAR 20 MG  TABS (OLMESARTAN MEDOXOMIL) One tablet by mouth daily  #30 x 6   Entered and Authorized by:   Storm Frisk MD   Signed by:   Storm Frisk MD on 10/02/2009   Method used:   Print then Give to Patient   RxID:   [id number] QVAR 40 MCG/ACT  AERS (BECLOMETHASONE DIPROPIONATE) Two puffs twice daily  #1 x 6   Entered and Authorized by:   Storm Frisk MD   Signed by:   Storm Frisk MD on 10/02/2009   Method used:   Print then Give to Patient   RxID:   [id number] EXBMWUX 15 MCG/2ML  NEBU (ARFORMOTEROL TARTRATE) One in nebulizer twice daily  #60 x 6   Entered and Authorized by:   Storm Frisk MD   Signed by:   Storm Frisk MD on 10/02/2009   Method used:   Print then Give to Patient   RxID:   [id number]  Immunization History:  Influenza Immunization History:    Influenza:  historical (02/16/2009)     CardioPerfect Spirometry  ID: [id number] Patient: Tonya [name], Tonya [name] DOB: 07-31-1922 Age: 77 Years Old Sex: Female Race: White Height: 65 Weight: 151 PPD: <0.25 Status: Confirmed Past Medical History:  POSITIVE PPD (ICD-795.5) FIBROCYSTIC BREAST DISEASE, HX OF (ICD-V13.9) DYSLIPIDEMIA (ICD-272.4) SHINGLES, HX OF (ICD-V13.8) PNEUMONIA (ICD-486) HYPOTHYROIDISM (ICD-244.9) COPD (ICD-496) VENOUS INSUFFICIENCY (ICD-459.81) HYPERTENSION (ICD-401.9)   Recorded: 10/02/2009 2:09 PM  Parameter  Measured Predicted %Predicted FVC     1.37        2.55        53.50 FEV1     0.64        1.89        33.70 FEV1%   46.61        72.96        63.90 PEF    1.61        4.54        35.40   Comments: Severe obstructive defect  Interpretation: Pre: FVC= 1.37L FEV1= 0.64L FEV1%= 46.6% 0.64/1.37 FEV1/FVC (10/02/2009 2:13:04 PM), Very severe obstruction.Moderately severe restriction     Appended Document: Pulmonary Consultation fax Lucky Cowboy

## 2010-06-20 NOTE — Miscellaneous (Signed)
Summary: CXR  Clinical Lists Changes  Observations: Added new observation of CXR RESULTS: No active disease, COPD changes (06/06/2009 8:58)      CXR  Procedure date:  06/06/2009  Findings:      No active disease, COPD changes

## 2010-06-20 NOTE — Miscellaneous (Signed)
Summary: Orders Update  Clinical Lists Changes  Orders: Added new Referral order of DME Referral (DME) - Signed 

## 2010-08-13 ENCOUNTER — Other Ambulatory Visit: Payer: Self-pay | Admitting: Critical Care Medicine

## 2011-04-25 ENCOUNTER — Other Ambulatory Visit: Payer: Self-pay | Admitting: Critical Care Medicine

## 2012-05-01 ENCOUNTER — Emergency Department (HOSPITAL_COMMUNITY): Payer: No Typology Code available for payment source

## 2012-05-01 ENCOUNTER — Encounter (HOSPITAL_COMMUNITY): Payer: Self-pay | Admitting: *Deleted

## 2012-05-01 ENCOUNTER — Inpatient Hospital Stay (HOSPITAL_COMMUNITY)
Admission: EM | Admit: 2012-05-01 | Discharge: 2012-05-07 | DRG: 193 | Disposition: A | Discharge status: Home / Self Care | Payer: No Typology Code available for payment source | Attending: Internal Medicine | Admitting: Internal Medicine

## 2012-05-01 DIAGNOSIS — J449 Chronic obstructive pulmonary disease, unspecified: Secondary | ICD-10-CM

## 2012-05-01 DIAGNOSIS — G250 Essential tremor: Secondary | ICD-10-CM

## 2012-05-01 DIAGNOSIS — I10 Essential (primary) hypertension: Secondary | ICD-10-CM

## 2012-05-01 DIAGNOSIS — E785 Hyperlipidemia, unspecified: Secondary | ICD-10-CM

## 2012-05-01 DIAGNOSIS — J9620 Acute and chronic respiratory failure, unspecified whether with hypoxia or hypercapnia: Secondary | ICD-10-CM | POA: Diagnosis present

## 2012-05-01 DIAGNOSIS — J4489 Other specified chronic obstructive pulmonary disease: Secondary | ICD-10-CM

## 2012-05-01 DIAGNOSIS — H409 Unspecified glaucoma: Secondary | ICD-10-CM | POA: Diagnosis present

## 2012-05-01 DIAGNOSIS — I509 Heart failure, unspecified: Secondary | ICD-10-CM | POA: Diagnosis not present

## 2012-05-01 DIAGNOSIS — I872 Venous insufficiency (chronic) (peripheral): Secondary | ICD-10-CM

## 2012-05-01 DIAGNOSIS — J441 Chronic obstructive pulmonary disease with (acute) exacerbation: Secondary | ICD-10-CM | POA: Diagnosis present

## 2012-05-01 DIAGNOSIS — J189 Pneumonia, unspecified organism: Principal | ICD-10-CM

## 2012-05-01 DIAGNOSIS — H548 Legal blindness, as defined in USA: Secondary | ICD-10-CM | POA: Diagnosis present

## 2012-05-01 DIAGNOSIS — Z87898 Personal history of other specified conditions: Secondary | ICD-10-CM

## 2012-05-01 DIAGNOSIS — J9600 Acute respiratory failure, unspecified whether with hypoxia or hypercapnia: Secondary | ICD-10-CM | POA: Diagnosis present

## 2012-05-01 HISTORY — DX: Emphysema, unspecified: J43.9

## 2012-05-01 HISTORY — DX: Essential (primary) hypertension: I10

## 2012-05-01 HISTORY — DX: Pneumonia, unspecified organism: J18.9

## 2012-05-01 LAB — BASIC METABOLIC PANEL
BUN: 13 mg/dL (ref 6–23)
CO2: 28 mEq/L (ref 19–32)
Calcium: 9.2 mg/dL (ref 8.4–10.5)
Chloride: 94 mEq/L — ABNORMAL LOW (ref 96–112)
Creatinine, Ser: 0.87 mg/dL (ref 0.50–1.10)

## 2012-05-01 LAB — CBC WITH DIFFERENTIAL/PLATELET
Basophils Absolute: 0 10*3/uL (ref 0.0–0.1)
Eosinophils Relative: 0 % (ref 0–5)
HCT: 35.3 % — ABNORMAL LOW (ref 36.0–46.0)
Hemoglobin: 12.1 g/dL (ref 12.0–15.0)
Lymphocytes Relative: 7 % — ABNORMAL LOW (ref 12–46)
MCHC: 34.3 g/dL (ref 30.0–36.0)
MCV: 91.2 fL (ref 78.0–100.0)
Monocytes Absolute: 1.4 10*3/uL — ABNORMAL HIGH (ref 0.1–1.0)
Monocytes Relative: 12 % (ref 3–12)
RDW: 12.8 % (ref 11.5–15.5)
WBC: 11.3 10*3/uL — ABNORMAL HIGH (ref 4.0–10.5)

## 2012-05-01 LAB — POCT I-STAT TROPONIN I: Troponin i, poc: 0.09 ng/mL (ref 0.00–0.08)

## 2012-05-01 MED ORDER — LEVALBUTEROL HCL 0.63 MG/3ML IN NEBU
0.6300 mg | INHALATION_SOLUTION | Freq: Four times a day (QID) | RESPIRATORY_TRACT | Status: DC | PRN
  Filled 2012-05-01: qty 3

## 2012-05-01 MED ORDER — METHYLPREDNISOLONE SODIUM SUCC 125 MG IJ SOLR
60.0000 mg | Freq: Two times a day (BID) | INTRAMUSCULAR | Status: DC
  Administered 2012-05-02 – 2012-05-04 (×7): 60 mg via INTRAVENOUS
  Filled 2012-05-01 (×9): qty 0.96

## 2012-05-01 MED ORDER — LEVALBUTEROL HCL 0.63 MG/3ML IN NEBU
0.6300 mg | INHALATION_SOLUTION | Freq: Four times a day (QID) | RESPIRATORY_TRACT | Status: DC
  Administered 2012-05-01 – 2012-05-06 (×19): 0.63 mg via RESPIRATORY_TRACT
  Filled 2012-05-01 (×23): qty 3

## 2012-05-01 MED ORDER — SODIUM CHLORIDE 0.9 % IV SOLN
INTRAVENOUS | Status: DC
  Administered 2012-05-01: 19:00:00 via INTRAVENOUS

## 2012-05-01 MED ORDER — ONDANSETRON HCL 4 MG PO TABS: 4.0000 mg | ORAL_TABLET | Freq: Four times a day (QID) | ORAL | Status: DC | PRN

## 2012-05-01 MED ORDER — ACETAMINOPHEN 650 MG RE SUPP: 650.0000 mg | Freq: Four times a day (QID) | RECTAL | Status: DC | PRN

## 2012-05-01 MED ORDER — TIOTROPIUM BROMIDE MONOHYDRATE 18 MCG IN CAPS
18.0000 ug | ORAL_CAPSULE | Freq: Every day | RESPIRATORY_TRACT | Status: DC
  Administered 2012-05-02 – 2012-05-06 (×5): 18 ug via RESPIRATORY_TRACT
  Filled 2012-05-01: qty 5

## 2012-05-01 MED ORDER — HEPARIN SODIUM (PORCINE) 5000 UNIT/ML IJ SOLN
5000.0000 [IU] | Freq: Three times a day (TID) | INTRAMUSCULAR | Status: DC
  Administered 2012-05-01 – 2012-05-07 (×17): 5000 [IU] via SUBCUTANEOUS
  Filled 2012-05-01 (×20): qty 1

## 2012-05-01 MED ORDER — LATANOPROST 0.005 % OP SOLN
1.0000 [drp] | Freq: Every day | OPHTHALMIC | Status: DC
  Administered 2012-05-01 – 2012-05-06 (×6): 1 [drp] via OPHTHALMIC
  Filled 2012-05-01: qty 2.5

## 2012-05-01 MED ORDER — SODIUM CHLORIDE 0.9 % IV SOLN
INTRAVENOUS | Status: AC
  Administered 2012-05-02 (×3): via INTRAVENOUS

## 2012-05-01 MED ORDER — ALBUTEROL SULFATE (5 MG/ML) 0.5% IN NEBU
5.0000 mg | INHALATION_SOLUTION | Freq: Once | RESPIRATORY_TRACT | Status: DC
  Filled 2012-05-01: qty 1

## 2012-05-01 MED ORDER — IPRATROPIUM BROMIDE 0.02 % IN SOLN
0.5000 mg | Freq: Once | RESPIRATORY_TRACT | Status: AC
  Administered 2012-05-01: 0.5 mg via RESPIRATORY_TRACT
  Filled 2012-05-01: qty 2.5

## 2012-05-01 MED ORDER — ALBUTEROL SULFATE (5 MG/ML) 0.5% IN NEBU
5.0000 mg | INHALATION_SOLUTION | Freq: Once | RESPIRATORY_TRACT | Status: AC
  Administered 2012-05-01: 5 mg via RESPIRATORY_TRACT
  Filled 2012-05-01: qty 1

## 2012-05-01 MED ORDER — METHYLPREDNISOLONE SODIUM SUCC 125 MG IJ SOLR
125.0000 mg | Freq: Once | INTRAMUSCULAR | Status: AC
  Administered 2012-05-01: 125 mg via INTRAVENOUS
  Filled 2012-05-01: qty 2

## 2012-05-01 MED ORDER — MONTELUKAST SODIUM 10 MG PO TABS
10.0000 mg | ORAL_TABLET | Freq: Every day | ORAL | Status: DC
  Administered 2012-05-01 – 2012-05-06 (×6): 10 mg via ORAL
  Filled 2012-05-01 (×7): qty 1

## 2012-05-01 MED ORDER — BUDESONIDE-FORMOTEROL FUMARATE 160-4.5 MCG/ACT IN AERO
2.0000 | INHALATION_SPRAY | Freq: Two times a day (BID) | RESPIRATORY_TRACT | Status: DC
  Administered 2012-05-01 – 2012-05-07 (×12): 2 via RESPIRATORY_TRACT
  Filled 2012-05-01: qty 6

## 2012-05-01 MED ORDER — LOSARTAN POTASSIUM 50 MG PO TABS
50.0000 mg | ORAL_TABLET | Freq: Every day | ORAL | Status: DC
  Administered 2012-05-02 – 2012-05-07 (×6): 50 mg via ORAL
  Filled 2012-05-01 (×6): qty 1

## 2012-05-01 MED ORDER — ACETAMINOPHEN 325 MG PO TABS
650.0000 mg | ORAL_TABLET | Freq: Four times a day (QID) | ORAL | Status: DC | PRN
  Administered 2012-05-01: 650 mg via ORAL
  Filled 2012-05-01: qty 2

## 2012-05-01 MED ORDER — ONDANSETRON HCL 4 MG/2ML IJ SOLN: 4.0000 mg | Freq: Four times a day (QID) | INTRAMUSCULAR | Status: DC | PRN

## 2012-05-01 MED ORDER — POLYETHYLENE GLYCOL 3350 17 G PO PACK
17.0000 g | PACK | Freq: Every day | ORAL | Status: DC | PRN
  Filled 2012-05-01: qty 1

## 2012-05-01 MED ORDER — LEVOFLOXACIN 500 MG PO TABS
500.0000 mg | ORAL_TABLET | Freq: Every day | ORAL | Status: DC
  Administered 2012-05-01: 500 mg via ORAL
  Filled 2012-05-01: qty 1

## 2012-05-01 MED ORDER — LEVOFLOXACIN 750 MG PO TABS
750.0000 mg | ORAL_TABLET | Freq: Every day | ORAL | Status: DC
  Filled 2012-05-01: qty 1

## 2012-05-01 MED ORDER — ALPRAZOLAM 0.25 MG PO TABS
0.2500 mg | ORAL_TABLET | Freq: Three times a day (TID) | ORAL | Status: DC | PRN
  Administered 2012-05-02: 0.25 mg via ORAL
  Administered 2012-05-02 – 2012-05-03 (×3): 0.5 mg via ORAL
  Administered 2012-05-03: 0.25 mg via ORAL
  Administered 2012-05-04: 0.5 mg via ORAL
  Administered 2012-05-04 – 2012-05-05 (×2): 0.25 mg via ORAL
  Administered 2012-05-05: 0.5 mg via ORAL
  Administered 2012-05-06: 0.25 mg via ORAL
  Administered 2012-05-06 – 2012-05-07 (×2): 0.5 mg via ORAL
  Filled 2012-05-01 (×2): qty 1
  Filled 2012-05-01: qty 2
  Filled 2012-05-01 (×2): qty 1
  Filled 2012-05-01: qty 2
  Filled 2012-05-01 (×2): qty 1
  Filled 2012-05-01: qty 2
  Filled 2012-05-01: qty 1
  Filled 2012-05-01 (×3): qty 2

## 2012-05-01 NOTE — ED Notes (Signed)
Improving i.e. You conversing with family a lot easier. However just coughing & pulling self up to sit up sets her back.

## 2012-05-01 NOTE — ED Provider Notes (Signed)
History     CSN: [id number]  Arrival date & time 05/01/12  1748   First MD Initiated Contact with Patient 05/01/12 1813      Chief Complaint  Patient presents with  . Shortness of Breath  . Cough    (Consider location/radiation/quality/duration/timing/severity/associated sxs/prior treatment) Patient is a 78 y.o. female presenting with shortness of breath and cough. The history is provided by the patient.  Shortness of Breath  Associated symptoms include cough and shortness of breath.  Cough Associated symptoms include shortness of breath.   patient here with shortness of breath and cough x1 week. No vomiting or diarrhea. Denies any fever. Cough has been productive of blood-tinged sputum according to the patient. She does have a history of COPD and does use home nebulizers and home oxygen. She's had pneumonia in the past. See is unsure of this feels similar. She was brought here by her family  Past Medical History  Diagnosis Date  . Emphysema   . Hypertension   . Pneumonia     Past Surgical History  Procedure Date  . Abdominal hysterectomy   . Hernia repair     inguinal  . Elbow surgery     History reviewed. No pertinent family history.  History  Substance Use Topics  . Smoking status: Former Games developer  . Smokeless tobacco: Not on file  . Alcohol Use:     OB History    Grav Para Term Preterm Abortions TAB SAB Ect Mult Living                  Review of Systems  Respiratory: Positive for cough and shortness of breath.   All other systems reviewed and are negative.    Allergies  Ciprofloxacin; Erythromycin; Penicillins; and Verapamil  Home Medications   Current Outpatient Rx  Name  Route  Sig  Dispense  Refill  . ALPRAZOLAM 0.5 MG PO TABS   Oral   Take 0.25-0.5 mg by mouth 3 (three) times daily as needed. For anxiety         . BUDESONIDE-FORMOTEROL FUMARATE 160-4.5 MCG/ACT IN AERO   Inhalation   Inhale 2 puffs into the lungs 2 (two) times daily.          . FUROSEMIDE 40 MG PO TABS   Oral   Take 20-40 mg by mouth 2 (two) times daily.         Marland Kitchen LATANOPROST 0.005 % OP SOLN   Both Eyes   Place 1 drop into both eyes at bedtime.         Marland Kitchen LOSARTAN POTASSIUM 50 MG PO TABS   Oral   Take 50 mg by mouth daily.         Marland Kitchen ZAFIRLUKAST 20 MG PO TABS   Oral   Take 20 mg by mouth 2 (two) times daily.           BP 159/82  Pulse 99  Temp 98.7 F (37.1 C) (Oral)  Resp 12  SpO2 97%  Physical Exam  Nursing note and vitals reviewed. Constitutional: She is oriented to person, place, and time. She appears well-developed and well-nourished.  Non-toxic appearance. No distress.  HENT:  Head: Normocephalic and atraumatic.  Eyes: Conjunctivae normal, EOM and lids are normal. Pupils are equal, round, and reactive to light.  Neck: Normal range of motion. Neck supple. No tracheal deviation present. No mass present.  Cardiovascular: Normal rate, regular rhythm and normal heart sounds.  Exam reveals no gallop.   No  murmur heard. Pulmonary/Chest: Effort normal. No stridor. No respiratory distress. She has decreased breath sounds. She has wheezes. She has no rhonchi. She has no rales.  Abdominal: Soft. Normal appearance and bowel sounds are normal. She exhibits no distension. There is no tenderness. There is no rebound and no CVA tenderness.  Musculoskeletal: Normal range of motion. She exhibits no edema and no tenderness.  Neurological: She is alert and oriented to person, place, and time. She has normal strength. No cranial nerve deficit or sensory deficit. GCS eye subscore is 4. GCS verbal subscore is 5. GCS motor subscore is 6.  Skin: Skin is warm and dry. No abrasion and no rash noted.  Psychiatric: She has a normal mood and affect. Her speech is normal and behavior is normal.    ED Course  Procedures (including critical care time)  Labs Reviewed - No data to display No results found.   No diagnosis found.    MDM   Date:  05/01/2012  Rate: 92  Rhythm: normal sinus rhythm  QRS Axis: normal  Intervals: normal  ST/T Wave abnormalities: normal  Conduction Disutrbances:none  Narrative Interpretation:   Old EKG Reviewed: none available  8:20 PM Patient given albuterol tx and Her wheezes remain. She remains symptomatic and tachypnic. She will have her albuterol treatment repeated her elevated troponin was noted . Her EKG shows no signs of ACS. She will be admitted by the hospitalist service  CRITICAL CARE Performed by: Toy Baker   Total critical care time: 55  Critical care time was exclusive of separately billable procedures and treating other patients.  Critical care was necessary to treat or prevent imminent or life-threatening deterioration.  Critical care was time spent personally by me on the following activities: development of treatment plan with patient and/or surrogate as well as nursing, discussions with consultants, evaluation of patient's response to treatment, examination of patient, obtaining history from patient or surrogate, ordering and performing treatments and interventions, ordering and review of laboratory studies, ordering and review of radiographic studies, pulse oximetry and re-evaluation of patient's condition.         Toy Baker, MD 05/01/12 2022

## 2012-05-01 NOTE — ED Notes (Signed)
Gave I Stat Troponin results to MD Allen 

## 2012-05-01 NOTE — H&P (Signed)
[name] History and Physical  TRAYONNA BACHMEIER [name]:[id number] DOB: Jul 18, 1922 DOA: 05/01/2012  Referring physician: Dr. Freida Busman PCP: No primary provider on file.  Specialists: none  Chief Complaint: Cough and SOB  HPI: Tonya [name] is a 78 y.o. female  Past medical history of COPD comes in for cough and shortness of breath 5 days prior to admission presently getting worse to the point where she can even walk to the bathroom without getting short of breath. She was changed prior to admission she started having productive cough and pink in color. She hasn't had a fever but here in the emergency room the temperature was checked this showed a temperature of 100.5. We were asked to admit and further evaluate.  Review of Systems: The patient denies anorexia, fever, weight loss,, vision loss, decreased hearing, hoarseness,  syncope, , peripheral edema, balance deficits, hemoptysis, abdominal pain, melena, hematochezia, severe indigestion/heartburn, hematuria, incontinence, genital sores, muscle weakness, suspicious skin lesions, transient blindness, difficulty walking, depression, unusual weight change, abnormal bleeding, enlarged lymph nodes, angioedema, and breast masses.    Past Medical History  Diagnosis Date  . Emphysema   . Hypertension   . Pneumonia    Past Surgical History  Procedure Date  . Abdominal hysterectomy   . Hernia repair     inguinal  . Elbow surgery    Social History:  reports that she has quit smoking. Her smoking use included Cigarettes. She has a 30 pack-year smoking history. She does not have any smokeless tobacco history on file. She reports that she does not drink alcohol or use illicit drugs. Is a normal daughter can perform all her ADLs  Allergies  Allergen Reactions  . Ciprofloxacin     REACTION: nausea  . Erythromycin     REACTION: rash  . Penicillins     REACTION: nausea  . Verapamil     REACTION: nausea    Family History  Problem  Relation Age of Onset  . Stroke Mother   . Kidney failure Father     Prior to Admission medications   Medication Sig Start Date End Date Taking? Authorizing Provider  ALPRAZolam Prudy Feeler) 0.5 MG tablet Take 0.25-0.5 mg by mouth 3 (three) times daily as needed. For anxiety   Yes Historical Provider, MD  beta carotene w/minerals (OCUVITE) tablet Take 1 tablet by mouth daily.   Yes Historical Provider, MD  budesonide-formoterol (SYMBICORT) 160-4.5 MCG/ACT inhaler Inhale 2 puffs into the lungs 2 (two) times daily.   Yes Historical Provider, MD  furosemide (LASIX) 40 MG tablet Take 20-40 mg by mouth 2 (two) times daily.   Yes Historical Provider, MD  latanoprost (XALATAN) 0.005 % ophthalmic solution Place 1 drop into the right eye at bedtime.    Yes Historical Provider, MD  losartan (COZAAR) 50 MG tablet Take 50 mg by mouth daily.   Yes Historical Provider, MD  zafirlukast (ACCOLATE) 20 MG tablet Take 20 mg by mouth 2 (two) times daily.   Yes Historical Provider, MD   Physical Exam: Filed Vitals:   05/01/12 1752 05/01/12 1812 05/01/12 2000 05/01/12 2012  BP: 159/82  134/57 138/64  Pulse: 99  92   Temp: 98.7 F (37.1 C)   100.5 F (38.1 C)  TempSrc: Oral   Rectal  Resp: 12  26 25   SpO2: 93% 97% 96% 98%     General:  Awake alert and oriented x3  Eyes: Anicteric no pallor  ENT: Dry mucous membranes  Neck: No JVD  Cardiovascular: Prosthetic  heart with a regular rate and rhythm positive S1-S2  Respiratory: Moderate air movement, with wheezing bilaterally and crackles on the right side  Abdomen: Positive bowel sounds nontender nondistended soft  Skin: No rashes ulcerations  Musculoskeletal: Intact  Psychiatric: Appropriate  Neurologic: Nonfocal  Labs on Admission:  Basic Metabolic Panel:  Lab 05/01/12 4098  NA 131*  K 3.9  CL 94*  CO2 28  GLUCOSE 125*  BUN 13  CREATININE 0.87  CALCIUM 9.2  MG --  PHOS --   Liver Function Tests: No results found for this basename:  AST:5,ALT:5,ALKPHOS:5,BILITOT:5,PROT:5,ALBUMIN:5 in the last 168 hours No results found for this basename: LIPASE:5,AMYLASE:5 in the last 168 hours No results found for this basename: AMMONIA:5 in the last 168 hours CBC:  Lab 05/01/12 1815  WBC 11.3*  NEUTROABS 9.0*  HGB 12.1  HCT 35.3*  MCV 91.2  PLT 145*   Cardiac Enzymes: No results found for this basename: CKTOTAL:5,CKMB:5,CKMBINDEX:5,TROPONINI:5 in the last 168 hours  BNP (last 3 results) No results found for this basename: PROBNP:3 in the last 8760 hours CBG: No results found for this basename: GLUCAP:5 in the last 168 hours  Radiological Exams on Admission: Dg Chest 2 View  05/01/2012  *RADIOLOGY REPORT*  Clinical Data: Effective cough, history bronchitis, emphysema  CHEST - 2 VIEW  Comparison: 06/07/1999  Findings: Upper-normal size of cardiac silhouette. Tract calcification aorta. Mediastinal contours and pulmonary vascularity grossly normal. Emphysematous and bronchitic changes consistent with COPD. Slight accentuation of perihilar interstitial markings versus previous exam, of uncertain acuity. No segmental infiltrate, pleural effusion, or pneumothorax. Bones diffusely demineralized.  IMPRESSION: Changes of COPD/chronic bronchitis. Minimal diffuse interstitial prominence increased since 2011 without definite focal consolidation.   Original Report Authenticated By: Ulyses Southward, M.D.     EKG: Sinus rhythm with PACs, normal axis no T wave inversions.  Assessment/Plan Principal Problem:  *Acute respiratory failure/ PNA (pneumonia)/ COPD: - This is likely multifactorial secondary to acute COPD exacerbation and pneumonia. She does have an allergy to macrolides. So start on Levaquin 750 IV, will start her on some steroids IV. Will give Xopenex and Spiriva and continue her Symbicort.  - Check sputum cultures. Tylenol for fevers. Start her on IV fluids as she seems to be dehydrated by physical exam to  - PT/OT consult  to try to  get her up and she significantly weak. I think her mild elevation in her troponin is stress. Her EKG does not show any significant changes concerning for ischemia. She is not complaining of any chest pain.  - Continue Xanax for anxiety as patient seems to be significantly anxious.  HYPERTENSION: - Hold her Lasix as she she has dry mucous membrane and decrease skin turgor. We'll start on IV fluids and reevaluate in the morning.  Code Status: full (must indicate code status--if unknown or must be presumed, indicate so) Family Communication: daughter (indicate person spoken with, if applicable, with phone number if by telephone) Disposition Plan: home 3-4 days(indicate anticipated LOS)  Time spent: [address] Rosine Beat [name] Pager [phone]  If 7PM-7AM, please contact night-coverage [email or web address] Password [email or web address] 05/01/2012, 9:01 PM

## 2012-05-01 NOTE — ED Notes (Signed)
Pt c/o SOB since yesterday and reports cough x's 1 week. Reports blood in mucous. Speaking short phrases in triage. Pt also has hx of emphysema.

## 2012-05-01 NOTE — ED Notes (Signed)
[id number]<AV> Expected date:<BR> Expected time:<BR> Means of arrival:<BR> Comments:<BR> Tri 3

## 2012-05-02 DIAGNOSIS — I10 Essential (primary) hypertension: Secondary | ICD-10-CM

## 2012-05-02 LAB — CBC
HCT: 30.7 % — ABNORMAL LOW (ref 36.0–46.0)
Hemoglobin: 10.7 g/dL — ABNORMAL LOW (ref 12.0–15.0)
MCH: 31.8 pg (ref 26.0–34.0)
MCHC: 34.9 g/dL (ref 30.0–36.0)
MCV: 91.1 fL (ref 78.0–100.0)
RDW: 12.8 % (ref 11.5–15.5)

## 2012-05-02 LAB — COMPREHENSIVE METABOLIC PANEL
ALT: 19 U/L (ref 0–35)
AST: 22 U/L (ref 0–37)
Alkaline Phosphatase: 63 U/L (ref 39–117)
Calcium: 8.4 mg/dL (ref 8.4–10.5)
Potassium: 3.6 mEq/L (ref 3.5–5.1)
Sodium: 133 mEq/L — ABNORMAL LOW (ref 135–145)
Total Protein: 5.8 g/dL — ABNORMAL LOW (ref 6.0–8.3)

## 2012-05-02 LAB — HIV ANTIBODY (ROUTINE TESTING W REFLEX): HIV: NONREACTIVE

## 2012-05-02 LAB — EXPECTORATED SPUTUM ASSESSMENT W GRAM STAIN, RFLX TO RESP C: Special Requests: NORMAL

## 2012-05-02 MED ORDER — BIOTENE DRY MOUTH MT LIQD
15.0000 mL | Freq: Two times a day (BID) | OROMUCOSAL | Status: DC
  Administered 2012-05-02 – 2012-05-07 (×11): 15 mL via OROMUCOSAL

## 2012-05-02 MED ORDER — LEVOFLOXACIN 750 MG PO TABS
750.0000 mg | ORAL_TABLET | ORAL | Status: DC
  Administered 2012-05-02 – 2012-05-06 (×3): 750 mg via ORAL
  Filled 2012-05-02 (×3): qty 1

## 2012-05-02 NOTE — Progress Notes (Signed)
[name] PROGRESS NOTE  Tonya [name] ZOX:[id number] DOB: May 02, 1923 DOA: 05/01/2012 PCP: No primary provider on file.  Assessment/Plan:  *Acute respiratory failure/ PNA (pneumonia)/ COPD:  - This is likely multifactorial secondary to acute COPD exacerbation and pneumonia. She does have an allergy to macrolides. So start on Levaquin 750 IV, will start her on some steroids IV. Will give Xopenex and Spiriva and continue her Symbicort.  - Check sputum cultures. Tylenol for fevers. Start her on IV fluids as she seems to be dehydrated by physical exam to  - PT/OT consult to try to get her up and she significantly weak. I think her mild elevation in her troponin is stress. Her EKG does not show any significant changes concerning for ischemia. She is not complaining of any chest pain.  - Continue Xanax for anxiety as patient seems to be significantly anxious.  HYPERTENSION:  - Hold her Lasix as she she has dry mucous membrane and decrease skin turgor.   Code Status: full (must indicate code status--if unknown or must be presumed, indicate so)  Family Communication: daughter (indicate person spoken with, if applicable, with phone number if by telephone)  Disposition Plan: home 3-4 days(indicate anticipated LOS)         Antibiotics:  levaquin  HPI/Subjective: comfortable  Objective: Filed Vitals:   05/02/12 0500 05/02/12 0635 05/02/12 0828 05/02/12 1422  BP: 93/50   109/44  Pulse: 70   80  Temp: 97.7 F (36.5 C)   98.5 F (36.9 C)  TempSrc: Oral   Oral  Resp: 28   20  Height:  5' 5.5" (1.664 m)    Weight:  66.271 kg (146 lb 1.6 oz)    SpO2: 96%  98% 100%    Intake/Output Summary (Last 24 hours) at 05/02/12 1927 Last data filed at 05/02/12 1700  Gross per 24 hour  Intake    480 ml  Output    800 ml  Net   -320 ml   Filed Weights   05/02/12 0635  Weight: 66.271 kg (146 lb 1.6 oz)    Exam:  General: Awake alert and oriented x3 Cardiovascular:  Prosthetic heart with a regular rate and rhythm positive S1-S2  Respiratory: Moderate air movement, with wheezing bilaterally and crackles on the right side Abdomen: Positive bowel sounds nontender nondistended soft  Skin: No rashes ulcerations   Data Reviewed: Basic Metabolic Panel:  Lab 05/02/12 8119 05/01/12 1815  NA 133* 131*  K 3.6 3.9  CL 96 94*  CO2 25 28  GLUCOSE 150* 125*  BUN 19 13  CREATININE 0.88 0.87  CALCIUM 8.4 9.2  MG -- --  PHOS -- --   Liver Function Tests:  Lab 05/02/12 0430  AST 22  ALT 19  ALKPHOS 63  BILITOT 0.6  PROT 5.8*  ALBUMIN 2.7*   No results found for this basename: LIPASE:5,AMYLASE:5 in the last 168 hours No results found for this basename: AMMONIA:5 in the last 168 hours CBC:  Lab 05/02/12 0430 05/01/12 1815  WBC 10.0 11.3*  NEUTROABS -- 9.0*  HGB 10.7* 12.1  HCT 30.7* 35.3*  MCV 91.1 91.2  PLT 120* 145*   Cardiac Enzymes: No results found for this basename: CKTOTAL:5,CKMB:5,CKMBINDEX:5,TROPONINI:5 in the last 168 hours BNP (last 3 results) No results found for this basename: PROBNP:3 in the last 8760 hours CBG: No results found for this basename: GLUCAP:5 in the last 168 hours  Recent Results (from the past 240 hour(s))  CULTURE, EXPECTORATED SPUTUM-ASSESSMENT  Status: Normal   Collection Time   05/02/12  2:36 PM      Component Value Range Status Comment   Specimen Description SPUTUM   Final    Special Requests Normal   Final    Sputum evaluation     Final    Value: THIS SPECIMEN IS ACCEPTABLE. RESPIRATORY CULTURE REPORT TO FOLLOW.   Report Status 05/02/2012 FINAL   Final      Studies: Dg Chest 2 View  05/01/2012  *RADIOLOGY REPORT*  Clinical Data: Effective cough, history bronchitis, emphysema  CHEST - 2 VIEW  Comparison: 06/07/1999  Findings: Upper-normal size of cardiac silhouette. Tract calcification aorta. Mediastinal contours and pulmonary vascularity grossly normal. Emphysematous and bronchitic changes  consistent with COPD. Slight accentuation of perihilar interstitial markings versus previous exam, of uncertain acuity. No segmental infiltrate, pleural effusion, or pneumothorax. Bones diffusely demineralized.  IMPRESSION: Changes of COPD/chronic bronchitis. Minimal diffuse interstitial prominence increased since 2011 without definite focal consolidation.   Original Report Authenticated By: Ulyses Southward, M.D.     Scheduled Meds:   . antiseptic oral rinse  15 mL Mouth Rinse BID  . budesonide-formoterol  2 puff Inhalation BID  . heparin  5,000 Units Subcutaneous Q8H  . latanoprost  1 drop Right Eye QHS  . levalbuterol  0.63 mg Nebulization Q6H  . levofloxacin  750 mg Oral Q48H  . losartan  50 mg Oral Daily  . methylPREDNISolone (SOLU-MEDROL) injection  60 mg Intravenous BID  . montelukast  10 mg Oral QHS  . tiotropium  18 mcg Inhalation Daily   Continuous Infusions:   . sodium chloride 100 mL/hr at 05/02/12 1517    Principal Problem:  *Acute respiratory failure Active Problems:  HYPERTENSION  COPD  PNA (pneumonia)        [facility]  [name] Pager [phone]. If 8PM-8AM, please contact night-coverage at [email or web address], password [facility] 05/02/2012, 7:27 PM  LOS: 1 day

## 2012-05-02 NOTE — Progress Notes (Signed)
ANTIBIOTIC CONSULT NOTE - INITIAL  Pharmacy Consult for antibiotic renal monitoring. Indication: pneumonia on levofloxacin  Allergies  Allergen Reactions  . Ciprofloxacin     REACTION: nausea  . Erythromycin     REACTION: rash  . Penicillins     REACTION: nausea  . Verapamil     REACTION: nausea    Patient Measurements: Height: 5' 5.5" (166.4 cm) Weight: 146 lb 1.6 oz (66.271 kg) IBW/kg (Calculated) : 58.15  Adjusted Body Weight:   Vital Signs: Temp: 97.7 F (36.5 C) (12/15 0500) Temp src: Oral (12/15 0500) BP: 93/50 mmHg (12/15 0500) Pulse Rate: 70  (12/15 0500) Intake/Output from previous day:   Intake/Output from this shift:    Labs:  Basename 05/02/12 0430 05/01/12 1815  WBC 10.0 11.3*  HGB 10.7* 12.1  PLT 120* 145*  LABCREA -- --  CREATININE 0.88 0.87   Estimated Creatinine Clearance: 41.4 ml/min (by C-G formula based on Cr of 0.88). No results found for this basename: VANCOTROUGH:2,VANCOPEAK:2,VANCORANDOM:2,GENTTROUGH:2,GENTPEAK:2,GENTRANDOM:2,TOBRATROUGH:2,TOBRAPEAK:2,TOBRARND:2,AMIKACINPEAK:2,AMIKACINTROU:2,AMIKACIN:2, in the last 72 hours   Microbiology: No results found for this or any previous visit (from the past 720 hour(s)).  Medical History: Past Medical History  Diagnosis Date  . Emphysema   . Hypertension   . Pneumonia     Medications:  Anti-infectives     Start     Dose/Rate Route Frequency Ordered Stop   05/02/12 2200   levofloxacin (LEVAQUIN) tablet 750 mg        750 mg Oral Every 48 hours 05/02/12 0640     05/02/12 1000   levofloxacin (LEVAQUIN) tablet 750 mg  Status:  Discontinued        750 mg Oral Daily 05/01/12 2209 05/02/12 0640   05/01/12 2045   levofloxacin (LEVAQUIN) tablet 500 mg  Status:  Discontinued        500 mg Oral Daily 05/01/12 2039 05/01/12 2209         Assessment: Patient with PNA/COPD exac.  Levofloxacin 750mg  po ordered, 500mg  po given. Patient with poor renal function  Goal of Therapy:   levofloxacin dosed based on patient weight and renal function   Plan:  Follow up culture results Change to 750mg  po q48hr due to renal function.  Darlina Guys, Jacquenette Shone Crowford 05/02/2012,6:49 AM

## 2012-05-03 DIAGNOSIS — G252 Other specified forms of tremor: Secondary | ICD-10-CM

## 2012-05-03 LAB — STREP PNEUMONIAE URINARY ANTIGEN: Strep Pneumo Urinary Antigen: NEGATIVE

## 2012-05-03 LAB — LEGIONELLA ANTIGEN, URINE: Legionella Antigen, Urine: NEGATIVE

## 2012-05-03 NOTE — Evaluation (Signed)
Physical Therapy Evaluation Patient Details Name: Tonya [name] MRN: [id number] DOB: [birth year] Today's Date: 05/03/2012 Time: 4098-1191 PT Time Calculation (min): 19 min  PT Assessment / Plan / Recommendation Clinical Impression  78 y.o. female admitted from Independent Living Facility with acute respiratory failure, PNA. Pt ambulated 37' with RW without LOB. SaO2 80% on RA while walking, though questionable accuracy of reading, SaO2 99% on 2L at rest. Recommend return to [facility] with HHPT. Pt would benefit from acute PT to maximize safety and independence with mobility.     PT Assessment  Patient needs continued PT services    Follow Up Recommendations  Home health PT    Does the patient have the potential to tolerate intense rehabilitation      Barriers to Discharge None      Equipment Recommendations  Rolling walker with 5" wheels    Recommendations for Other Services OT consult   Frequency Min 3X/week    Precautions / Restrictions Restrictions Weight Bearing Restrictions: No   Pertinent Vitals/Pain *0/10 pain SaO2 99% on 2L at rest; 80% on RA with walking (questionable accuracy due to poor waveform and pt had cold fingers)**      Mobility  Bed Mobility Bed Mobility: Supine to Sit Supine to Sit: HOB elevated;6: Modified independent (Device/Increase time);With rails Transfers Transfers: Sit to Stand;Stand to Sit Sit to Stand: 5: Supervision Stand to Sit: 5: Supervision Ambulation/Gait Ambulation/Gait Assistance: 5: Supervision Ambulation Distance (Feet): 45 Feet Assistive device: Rolling walker Gait Pattern: Within Functional Limits General Gait Details: pt steady with walking, distance limited by fatigue; SaO2 reading of 80% on RA while walking, but accuracy questionable due to poor waveform and pt had cold fingers; 99% on 2L at rest    Shoulder Instructions     Exercises     PT Diagnosis: Generalized weakness  PT Problem List: Decreased activity  tolerance;Decreased mobility PT Treatment Interventions: DME instruction;Functional mobility training;Therapeutic activities;Therapeutic exercise;Patient/family education;Gait training   PT Goals Acute Rehab PT Goals PT Goal Formulation: With patient/family Time For Goal Achievement: 05/17/12 Potential to Achieve Goals: Good Pt will go Supine/Side to Sit: Independently;with HOB 0 degrees PT Goal: Supine/Side to Sit - Progress: Goal set today Pt will go Sit to Stand: Independently PT Goal: Sit to Stand - Progress: Goal set today Pt will Ambulate: >150 feet;with modified independence;with least restrictive assistive device PT Goal: Ambulate - Progress: Goal set today Pt will Perform Home Exercise Program: Independently PT Goal: Perform Home Exercise Program - Progress: Goal set today  Visit Information  Last PT Received On: 05/03/12 Assistance Needed: +1    Subjective Data  Subjective: I was working on balance with the therapists at [facility] [employer]. Patient Stated Goal: return to independent living   Prior Functioning  Home Living Type of Home: Independent living facility Home Access: Level entry Prior Function Level of Independence: Independent Communication Communication: HOH    Cognition  Overall Cognitive Status: Appears within functional limits for tasks assessed/performed Arousal/Alertness: Awake/alert Orientation Level: Appears intact for tasks assessed Behavior During Session: [name] for tasks performed    Extremity/Trunk Assessment Right Upper Extremity Assessment RUE ROM/Strength/Tone: [facility] for tasks assessed Left Upper Extremity Assessment LUE ROM/Strength/Tone: WFL for tasks assessed (h/o L elbow fx) Right Lower Extremity Assessment RLE ROM/Strength/Tone: Within functional levels RLE Sensation: WFL - Light Touch RLE Coordination: WFL - gross/fine motor Left Lower Extremity Assessment LLE ROM/Strength/Tone: Within functional levels LLE Sensation: WFL - Light  Touch LLE Coordination: WFL - gross/fine motor Trunk Assessment Trunk Assessment:  Normal   Balance    End of Session PT - End of Session Activity Tolerance: Patient limited by fatigue Patient left: in chair;with call bell/phone within reach;with family/visitor present Nurse Communication: Mobility status  GP     Tonya [name] 05/03/2012, 9:54 AM  [phone]

## 2012-05-03 NOTE — Clinical Social Work Psychosocial (Addendum)
    Clinical Social Work Department BRIEF PSYCHOSOCIAL ASSESSMENT 05/03/2012  Patient:  Tonya [name], Tonya [name]     Account Number:  [id number]     Admit date:  05/01/2012  Clinical Social Worker:  Jacelyn Grip  Date/Time:  05/03/2012 11:45 AM  Referred by:  Physician  Date Referred:  05/03/2012 Referred for  ALF Placement   Other Referral:   However, pt is from [facility]   Interview type:  Patient Other interview type:   and patient daughter at bedside    PSYCHOSOCIAL DATA Living Status:  FACILITY Admitted from facility:  [facility] Level of care:  Independent Living Primary support name:  [name] Dudley/daughter Primary support relationship to patient:  CHILD, ADULT Degree of support available:   adequate    CURRENT CONCERNS Current Concerns  Post-Acute Placement   Other Concerns:    SOCIAL WORK ASSESSMENT / PLAN CSW had received a referral that pt admitted from [facility] ALF.    CSW spoke with pt and pt daughter at bedside re: disposition planning. Pt stated that she is a resident of [facility]DEP[facility] at [facility]. Per pt and pt daughter, plan is to return to ILF with home health services and private care services through [facility]. CSW reviewed PT/OT evaluation which note that pt does not need a higher level of care at this time, but [facility] services at ILF. CSW notified RNCM in order for RNCM to follow up about home needs. No social work needs identified. CSW signed off.   Assessment/plan status:  No Further Intervention Required Other assessment/ plan:   Information/referral to community resources:   Referral to [facility]    PATIENT'S/FAMILY'S RESPONSE TO PLAN OF CARE: Pt alert and oriented x 4. Pt is motivated to remain in indpendent living section of [facility].   Jacklynn Lewis, MSW, LCSWA  Clinical Social Work [phone]

## 2012-05-03 NOTE — Care Management Note (Signed)
  Page 2 of 2   05/03/2012     6:38:32 PM   CARE MANAGEMENT NOTE 05/03/2012  Patient:  Tonya [name], Tonya [name]   Account Number:  [id number]  Date Initiated:  05/03/2012  Documentation initiated by:  Colleen Can  Subjective/Objective Assessment:   dx COPD excerbation/acute resp failure     Action/Plan:   CM spoke with patient, dayghter and son-in-law. Pt is from Independent Living at [facility]. She wants to go bck to ALF with assistance from  onsite pt/ot services-[facility] who will come to her apartment to provide therapy   Anticipated DC Date:  05/06/2012   Anticipated DC Plan:  HOME W HOME HEALTH SERVICES  In-house referral  Clinical Social Worker      DC Planning Services  CM consult      [facility] Choice  HOME HEALTH   Choice offered to / List presented to:             Status of service:   Medicare Important Message given?   (If response is "NO", the following Medicare IM given date fields will be blank) Date Medicare IM given:   Date Additional Medicare IM given:    Discharge Disposition:    Per UR Regulation:    If discussed at Long Length of Stay Meetings, dates discussed:    Comments:  05/03/2012 Damaris Schooner RN CCM [phone] Pt states she already has DME which includes RW. She is currently receiving O2 therapy and is not sure if she will need home oxygen. That is yet to be determined. She wishes to have personnal care services which she thinks is provided by [facility]. Her dayghter has alrady made contact with [facility] who provide personal care services. CM will make contact with [facility] to check on process of making referral to [facility] for home therapy-PT/OT. Also will inquire about personal care services. CM called [phone] and spoke with Arty Baumgartner -Residence Relations. She advised that [facility] provides list of private duty persoanl care agencies to residents but they have to make contact with agency. [facility] and [facility]  are 2 of the agencies that residents use frequently. She also states if patient required Home pt/ot services that [facility] could provide those services and orders would need to be faxed to [phone] to her attention and she would get information to [facility]. This information will be given to patient and her family.

## 2012-05-03 NOTE — Evaluation (Signed)
Occupational Therapy Evaluation Patient Details Name: Tonya [name] MRN: [id number] DOB: Nov 08, 1922 Today's Date: 05/03/2012 Time: 0922-0955 OT Time Calculation (min): 33 min  OT Assessment / Plan / Recommendation Clinical Impression  Pt is an 78 y/o female, HOH w/ macular degeneration and anxiety admitted w/ dx PNA & COPD, ARF, from independent living facility. Pt amb w/ Min guard assist w/ SaO2 80% on room air while walking, though questionable accuracy of reading, SaO2 99% on 2L at rest. Recommend return to [facility] with HHOT. Pt would benefit from acute OT to maximize safety and independence with ADL's and self care as well as activity tolerance.    OT Assessment  Patient needs continued OT Services    Follow Up Recommendations  Home health OT    Barriers to Discharge      Equipment Recommendations  None recommended by OT    Recommendations for Other Services    Frequency  Min 2X/week    Precautions / Restrictions Restrictions Weight Bearing Restrictions: No   Pertinent Vitals/Pain No c/o during session    ADL  Eating/Feeding: Performed;Modified independent Where Assessed - Eating/Feeding: Bed level Grooming: Performed;Wash/dry hands;Brushing hair;Modified independent Where Assessed - Grooming: Supported sitting Upper Body Bathing: Simulated;Minimal assistance;Set up Where Assessed - Upper Body Bathing: Supported sitting Lower Body Bathing: Simulated;Minimal assistance Where Assessed - Lower Body Bathing: Supported sit to stand Upper Body Dressing: Performed;Minimal assistance;Other (comment) (Secondary to IV/lines) Where Assessed - Upper Body Dressing: Unsupported sitting Lower Body Dressing: Performed;Minimal assistance;Other (comment) (Don/doff socks) Where Assessed - Lower Body Dressing: Unsupported sitting Toilet Transfer: Performed;Min guard Toilet Transfer Method: Sit to Barista: Materials engineer  and Hygiene: Performed;Supervision/safety Where Assessed - Engineer, mining and Hygiene: Sit on 3-in-1 or toilet Tub/Shower Transfer Method: Not assessed Equipment Used: Gait belt;Rolling walker Transfers/Ambulation Related to ADLs: Pt with decrease in O2 with amb in hallway noted (drecreased to 80%, question reliability when rechecked with another device as that read 99%), cont to assess. ADL Comments: Pt is an 78 y/o female, HOH w/ macular degeneration and anxiety admitted w/ dx PNA & COPD, ARF, from independent living facility. Pt amb w/ Min guard assist w/ SaO2 80% on RA while walking, though questionable accuracy of reading, SaO2 99% on 2L at rest. Recommend return to [facility] with HHOT. Pt would benefit from acute OT to maximize safety and independence with ADL's and self care.      OT Diagnosis: Generalized weakness  OT Problem List: Decreased activity tolerance;Decreased strength;Decreased knowledge of use of DME or AE;Cardiopulmonary status limiting activity OT Treatment Interventions: Self-care/ADL training;Energy conservation;DME and/or AE instruction;Therapeutic activities;Therapeutic exercise;Patient/family education   OT Goals Acute Rehab OT Goals OT Goal Formulation: With patient/family Time For Goal Achievement: 05/24/12 Potential to Achieve Goals: Good ADL Goals Pt Will Perform Grooming: with modified independence;Sitting, chair;Sitting at sink;Sitting, edge of bed ADL Goal: Grooming - Progress: Goal set today Pt Will Perform Upper Body Bathing: with modified independence;Sitting, edge of bed;Sitting at sink ADL Goal: Upper Body Bathing - Progress: Goal set today Pt Will Perform Lower Body Bathing: with modified independence;Sit to stand from bed;Sit to stand from chair ADL Goal: Lower Body Bathing - Progress: Goal set today Pt Will Perform Upper Body Dressing: with modified independence;Sitting, chair;Sitting, bed ADL Goal: Upper Body Dressing - Progress:  Goal set today Pt Will Perform Lower Body Dressing: with modified independence;with adaptive equipment;Sit to stand from chair;Sit to stand from bed ADL Goal: Lower Body Dressing - Progress:  Goal set today Pt Will Transfer to Toilet: with modified independence;Ambulation;with DME;3-in-1 ADL Goal: Toilet Transfer - Progress: Goal set today Pt Will Perform Toileting - Clothing Manipulation: with modified independence;Sitting on 3-in-1 or toilet ADL Goal: Toileting - Clothing Manipulation - Progress: Goal set today Pt Will Perform Toileting - Hygiene: with modified independence;Sitting on 3-in-1 or toilet ADL Goal: Toileting - Hygiene - Progress: Goal set today Additional ADL Goal #1: Pt will tolerate 20 Min or more therapeutic activity w/ no more than 2 rest breaks in prep for increased endurance and activity tolerance for ADL's. ADL Goal: Additional Goal #1 - Progress: Goal set today  Visit Information  Last OT Received On: 05/03/12 Assistance Needed: +1    Subjective Data  Subjective: Pt reports shes "ready to try and get up" Patient Stated Goal: Return to PLOF at [facility]   Prior Functioning     Home Living Type of Home: Independent living facility Home Access: Level entry Bathroom Shower/Tub: Walk-in shower Bathroom Toilet: Handicapped height Home Adaptive Equipment: Bedside commode/3-in-1;Hand-held shower hose;Shower chair with back;Walker - four wheeled Additional Comments: Pt reports that she was I bathing and dressing PTA Prior Function Level of Independence: Independent Communication Communication: HOH Dominant Hand: Right    Vision/Perception  Wears glasses at all times, h/o macular degeneration. Monitor environment for possible adaptations   Cognition  Overall Cognitive Status: Appears within functional limits for tasks assessed/performed Arousal/Alertness: Awake/alert Orientation Level: Appears intact for tasks assessed Behavior During Session: [facility] for tasks  performed    Extremity/Trunk Assessment Right Upper Extremity Assessment RUE ROM/Strength/Tone: [facility] for tasks assessed Left Upper Extremity Assessment LUE ROM/Strength/Tone: [facility] for tasks assessed;Deficits LUE ROM/Strength/Tone Deficits: Pt w/ h/o elbow fx and limited elbow AROM end range and shoulder flexion noted. DOI approx 10 years ago. Right Lower Extremity Assessment RLE ROM/Strength/Tone: Within functional levels RLE Sensation: WFL - Light Touch RLE Coordination: WFL - gross/fine motor Left Lower Extremity Assessment LLE ROM/Strength/Tone: Within functional levels LLE Sensation: WFL - Light Touch LLE Coordination: WFL - gross/fine motor Trunk Assessment Trunk Assessment: Normal     Mobility Bed Mobility Bed Mobility: Supine to Sit Supine to Sit: HOB elevated;6: Modified independent (Device/Increase time);With rails Transfers Sit to Stand: 5: Supervision;4: Min guard Stand to Sit: 5: Supervision;4: Min guard                  End of Session OT - End of Session Equipment Utilized During Treatment: Gait belt;Other (comment) (RW) Activity Tolerance: Treatment limited secondary to medical complications (Comment);Other (comment) (Decrease in O2 SATS) Patient left: in chair;with call bell/phone within reach;with family/visitor present;with nursing in room Nurse Communication: Mobility status  GO     Alm Bustard 05/03/2012, 12:06 PM

## 2012-05-03 NOTE — Progress Notes (Signed)
[name] PROGRESS NOTE  Tonya [name] ZOX:[id number] DOB: 11/23/1922 DOA: 05/01/2012 PCP: No primary provider on file.  Assessment/Plan:  *Acute respiratory failure/ PNA (pneumonia)/ COPD:  - This is likely multifactorial secondary to acute COPD exacerbation and pneumonia. She does have an allergy to macrolides.  Started her Levaquin 750 IV, continue with solumedrol. Will give Xopenex and Spiriva and continue her Symbicort.  - Check sputum cultures. Tylenol for fevers.  - PT/OT consult to try to get her up and she significantly weak. I think her mild elevation in her troponin is stress. Her EKG does not show any significant changes concerning for ischemia. She is not complaining of any chest pain.  - Continue Xanax for anxiety as patient seems to be significantly anxious.  HYPERTENSION:  - Hold her Lasix as she she has dry mucous membrane and decrease skin turgor.   Code Status: full (must indicate code status--if unknown or must be presumed, indicate so)  Family Communication: daughter (indicate person spoken with, if applicable, with phone number if by telephone)  Disposition Plan: home 3-4 days(indicate anticipated LOS)         Antibiotics:  levaquin  HPI/Subjective: comfortable  Objective: Filed Vitals:   05/03/12 0615 05/03/12 0818 05/03/12 1447 05/03/12 1948  BP: 124/52  107/58   Pulse: 86  69   Temp: 97.5 F (36.4 C)  98.3 F (36.8 C)   TempSrc: Oral  Oral   Resp: 18  20 20   Height:      Weight: 69.264 kg (152 lb 11.2 oz)     SpO2: 97% 99% 100%     Intake/Output Summary (Last 24 hours) at 05/03/12 2015 Last data filed at 05/03/12 1446  Gross per 24 hour  Intake    240 ml  Output    800 ml  Net   -560 ml   Filed Weights   05/02/12 0635 05/03/12 0615  Weight: 66.271 kg (146 lb 1.6 oz) 69.264 kg (152 lb 11.2 oz)    Exam:  General: Awake alert and oriented x3 Cardiovascular: Prosthetic heart with a regular rate and rhythm positive S1-S2   Respiratory: Moderate air movement, with wheezing bilaterally and crackles on the right side Abdomen: Positive bowel sounds nontender nondistended soft  Skin: No rashes ulcerations   Data Reviewed: Basic Metabolic Panel:  Lab 05/02/12 8119 05/01/12 1815  NA 133* 131*  K 3.6 3.9  CL 96 94*  CO2 25 28  GLUCOSE 150* 125*  BUN 19 13  CREATININE 0.88 0.87  CALCIUM 8.4 9.2  MG -- --  PHOS -- --   Liver Function Tests:  Lab 05/02/12 0430  AST 22  ALT 19  ALKPHOS 63  BILITOT 0.6  PROT 5.8*  ALBUMIN 2.7*   No results found for this basename: LIPASE:5,AMYLASE:5 in the last 168 hours No results found for this basename: AMMONIA:5 in the last 168 hours CBC:  Lab 05/02/12 0430 05/01/12 1815  WBC 10.0 11.3*  NEUTROABS -- 9.0*  HGB 10.7* 12.1  HCT 30.7* 35.3*  MCV 91.1 91.2  PLT 120* 145*   Cardiac Enzymes: No results found for this basename: CKTOTAL:5,CKMB:5,CKMBINDEX:5,TROPONINI:5 in the last 168 hours BNP (last 3 results) No results found for this basename: PROBNP:3 in the last 8760 hours CBG: No results found for this basename: GLUCAP:5 in the last 168 hours  Recent Results (from the past 240 hour(s))  CULTURE, BLOOD (ROUTINE X 2)     Status: Normal (Preliminary result)   Collection Time  05/01/12 10:40 PM      Component Value Range Status Comment   Specimen Description BLOOD RIGHT HAND   Final    Special Requests BOTTLES DRAWN AEROBIC AND ANAEROBIC 10CC   Final    Culture  Setup Time 05/02/2012 16:07   Final    Culture     Final    Value:        BLOOD CULTURE RECEIVED NO GROWTH TO DATE CULTURE WILL BE HELD FOR 5 DAYS BEFORE ISSUING A FINAL NEGATIVE REPORT   Report Status PENDING   Incomplete   CULTURE, BLOOD (ROUTINE X 2)     Status: Normal (Preliminary result)   Collection Time   05/01/12 10:45 PM      Component Value Range Status Comment   Specimen Description BLOOD LEFT HAND   Final    Special Requests BOTTLES DRAWN AEROBIC ONLY 5CC   Final    Culture   Setup Time 05/02/2012 16:07   Final    Culture     Final    Value:        BLOOD CULTURE RECEIVED NO GROWTH TO DATE CULTURE WILL BE HELD FOR 5 DAYS BEFORE ISSUING A FINAL NEGATIVE REPORT   Report Status PENDING   Incomplete   CULTURE, EXPECTORATED SPUTUM-ASSESSMENT     Status: Normal   Collection Time   05/02/12  2:36 PM      Component Value Range Status Comment   Specimen Description SPUTUM   Final    Special Requests Normal   Final    Sputum evaluation     Final    Value: THIS SPECIMEN IS ACCEPTABLE. RESPIRATORY CULTURE REPORT TO FOLLOW.   Report Status 05/02/2012 FINAL   Final   CULTURE, RESPIRATORY     Status: Normal (Preliminary result)   Collection Time   05/02/12  2:36 PM      Component Value Range Status Comment   Specimen Description SPUTUM   Final    Special Requests NONE   Final    Gram Stain     Final    Value: ABUNDANT WBC PRESENT, PREDOMINANTLY PMN     RARE SQUAMOUS EPITHELIAL CELLS PRESENT     RARE GRAM NEGATIVE RODS     RARE GRAM POSITIVE COCCI IN PAIRS   Culture Culture reincubated for better growth   Final    Report Status PENDING   Incomplete      Studies: No results found.  Scheduled Meds:    . antiseptic oral rinse  15 mL Mouth Rinse BID  . budesonide-formoterol  2 puff Inhalation BID  . heparin  5,000 Units Subcutaneous Q8H  . latanoprost  1 drop Right Eye QHS  . levalbuterol  0.63 mg Nebulization Q6H  . levofloxacin  750 mg Oral Q48H  . losartan  50 mg Oral Daily  . methylPREDNISolone (SOLU-MEDROL) injection  60 mg Intravenous BID  . montelukast  10 mg Oral QHS  . tiotropium  18 mcg Inhalation Daily   Continuous Infusions:   Principal Problem:  *Acute respiratory failure Active Problems:  HYPERTENSION  COPD  PNA (pneumonia)        Tonya [name]  [name] Pager [phone]. If 8PM-8AM, please contact night-coverage at [email or web address], password [facility] 05/03/2012, 8:15 PM  LOS: 2 days

## 2012-05-04 ENCOUNTER — Inpatient Hospital Stay (HOSPITAL_COMMUNITY): Payer: No Typology Code available for payment source

## 2012-05-04 LAB — CBC
HCT: 30.5 % — ABNORMAL LOW (ref 36.0–46.0)
Hemoglobin: 10.4 g/dL — ABNORMAL LOW (ref 12.0–15.0)
MCH: 31.8 pg (ref 26.0–34.0)
RBC: 3.27 MIL/uL — ABNORMAL LOW (ref 3.87–5.11)

## 2012-05-04 LAB — CULTURE, RESPIRATORY W GRAM STAIN: Culture: NORMAL

## 2012-05-04 LAB — TROPONIN I: Troponin I: <0.3 ng/mL (ref <0.30)

## 2012-05-04 NOTE — Progress Notes (Signed)
[name] PROGRESS NOTE  Tonya [name] ZOX:[id number] DOB: Jun 23, 1922 DOA: 05/01/2012 PCP: No primary provider on file.  Assessment/Plan:  *Acute respiratory failure/ PNA (pneumonia)/ COPD:  - This is likely multifactorial secondary to acute COPD exacerbation and pneumonia. She does have an allergy to macrolides.  Started her Levaquin 750 IV, continue with solumedrol for today as she is still wheezing bilaterally.if her wheezing improves, change to oral prednisone in am.  Will give Xopenex and Spiriva and continue her Symbicort.  - sputum cultures revealed normal oropharyngeal flora. Tylenol for fevers. Repeat CXR TODAY.  - PT/OT consult to try to get her up and she significantly weak. I think her mild elevation in her troponin is stress. Her EKG does not show any significant changes concerning for ischemia. She is not complaining of any chest pain. Her repeat troponin was negative.  - Continue Xanax for anxiety as patient seems to be significantly anxious.   As per the daughter she is non compliant to medications and she she has not been on oxygen at ALF. But she will require oxygen on discharge.  HYPERTENSION:  - Hold her Lasix as she she has dry mucous membrane and decrease skin turgor.   Rash on her arms and back: possibly eczema. Will require a dermatology consult as outpatient.   Code Status: full (must indicate code status--if unknown or must be presumed, indicate so)  Family Communication: daughter (indicate person spoken with, if applicable, with phone number if by telephone)  Disposition Plan: home 3-4 days(indicate anticipated LOS)         Antibiotics:  levaquin  HPI/Subjective: comfortable  Objective: Filed Vitals:   05/04/12 0631 05/04/12 0758 05/04/12 1420 05/04/12 1445  BP: 126/53   122/60  Pulse: 86   82  Temp: 98.1 F (36.7 C)   98 F (36.7 C)  TempSrc: Oral   Oral  Resp: 20   20  Height:      Weight:      SpO2: 97% 100% 96% 96%     Intake/Output Summary (Last 24 hours) at 05/04/12 2020 Last data filed at 05/04/12 1919  Gross per 24 hour  Intake    780 ml  Output   1901 ml  Net  -1121 ml   Filed Weights   05/02/12 0635 05/03/12 0615  Weight: 66.271 kg (146 lb 1.6 oz) 69.264 kg (152 lb 11.2 oz)    Exam:  General: Awake alert and oriented x3 Cardiovascular: Prosthetic heart with a regular rate and rhythm positive S1-S2  Respiratory: Moderate air movement, with wheezing bilaterally and crackles  Abdomen: Positive bowel sounds nontender nondistended soft  Skin: rash on the right arm and forearm possibly eczema.    Data Reviewed: Basic Metabolic Panel:  Lab 05/02/12 8119 05/01/12 1815  NA 133* 131*  K 3.6 3.9  CL 96 94*  CO2 25 28  GLUCOSE 150* 125*  BUN 19 13  CREATININE 0.88 0.87  CALCIUM 8.4 9.2  MG -- --  PHOS -- --   Liver Function Tests:  Lab 05/02/12 0430  AST 22  ALT 19  ALKPHOS 63  BILITOT 0.6  PROT 5.8*  ALBUMIN 2.7*   No results found for this basename: LIPASE:5,AMYLASE:5 in the last 168 hours No results found for this basename: AMMONIA:5 in the last 168 hours CBC:  Lab 05/04/12 1220 05/02/12 0430 05/01/12 1815  WBC 15.4* 10.0 11.3*  NEUTROABS -- -- 9.0*  HGB 10.4* 10.7* 12.1  HCT 30.5* 30.7* 35.3*  MCV 93.3  91.1 91.2  PLT 162 120* 145*   Cardiac Enzymes:  Lab 05/04/12 1220  CKTOTAL --  CKMB --  CKMBINDEX --  TROPONINI <0.30   BNP (last 3 results) No results found for this basename: PROBNP:3 in the last 8760 hours CBG: No results found for this basename: GLUCAP:5 in the last 168 hours  Recent Results (from the past 240 hour(s))  CULTURE, BLOOD (ROUTINE X 2)     Status: Normal (Preliminary result)   Collection Time   05/01/12 10:40 PM      Component Value Range Status Comment   Specimen Description BLOOD RIGHT HAND   Final    Special Requests BOTTLES DRAWN AEROBIC AND ANAEROBIC 10CC   Final    Culture  Setup Time 05/02/2012 16:07   Final    Culture      Final    Value:        BLOOD CULTURE RECEIVED NO GROWTH TO DATE CULTURE WILL BE HELD FOR 5 DAYS BEFORE ISSUING A FINAL NEGATIVE REPORT   Report Status PENDING   Incomplete   CULTURE, BLOOD (ROUTINE X 2)     Status: Normal (Preliminary result)   Collection Time   05/01/12 10:45 PM      Component Value Range Status Comment   Specimen Description BLOOD LEFT HAND   Final    Special Requests BOTTLES DRAWN AEROBIC ONLY 5CC   Final    Culture  Setup Time 05/02/2012 16:07   Final    Culture     Final    Value:        BLOOD CULTURE RECEIVED NO GROWTH TO DATE CULTURE WILL BE HELD FOR 5 DAYS BEFORE ISSUING A FINAL NEGATIVE REPORT   Report Status PENDING   Incomplete   CULTURE, EXPECTORATED SPUTUM-ASSESSMENT     Status: Normal   Collection Time   05/02/12  2:36 PM      Component Value Range Status Comment   Specimen Description SPUTUM   Final    Special Requests Normal   Final    Sputum evaluation     Final    Value: THIS SPECIMEN IS ACCEPTABLE. RESPIRATORY CULTURE REPORT TO FOLLOW.   Report Status 05/02/2012 FINAL   Final   CULTURE, RESPIRATORY     Status: Normal   Collection Time   05/02/12  2:36 PM      Component Value Range Status Comment   Specimen Description SPUTUM   Final    Special Requests NONE   Final    Gram Stain     Final    Value: ABUNDANT WBC PRESENT, PREDOMINANTLY PMN     RARE SQUAMOUS EPITHELIAL CELLS PRESENT     RARE GRAM NEGATIVE RODS     RARE GRAM POSITIVE COCCI IN PAIRS   Culture NORMAL OROPHARYNGEAL FLORA   Final    Report Status 05/04/2012 FINAL   Final      Studies: No results found.  Scheduled Meds:    . antiseptic oral rinse  15 mL Mouth Rinse BID  . budesonide-formoterol  2 puff Inhalation BID  . heparin  5,000 Units Subcutaneous Q8H  . latanoprost  1 drop Right Eye QHS  . levalbuterol  0.63 mg Nebulization Q6H  . levofloxacin  750 mg Oral Q48H  . losartan  50 mg Oral Daily  . methylPREDNISolone (SOLU-MEDROL) injection  60 mg Intravenous BID  .  montelukast  10 mg Oral QHS  . tiotropium  18 mcg Inhalation Daily   Continuous Infusions:  Principal Problem:  *Acute respiratory failure Active Problems:  HYPERTENSION  COPD  PNA (pneumonia)        Delia Sitar  [name] Pager [phone]. If 8PM-8AM, please contact night-coverage at [email or web address], password [facility] 05/04/2012, 8:20 PM  LOS: 3 days

## 2012-05-04 NOTE — Progress Notes (Signed)
05/04/2012 Colleen Can BSN RN CCM [phone] CM spoke with patient and family members regarding [facility] services at [employer]. Tehy wish to use [facility] for therapy as they have used in the past. They will also use personal care services as patient has used in the past. Family understands that they need to make contact with their chosen company to make arrangemnts for private-self pay care forpatient. CM faxed HHPT orders to [phone] with confirmation; spoke with Tirsh in therapy department at [facility] in [place] [employer]. Pt currently still on O2. Home O2 has not been addressed at this time. CM will follow

## 2012-05-05 MED ORDER — PREDNISONE 50 MG PO TABS
80.0000 mg | ORAL_TABLET | Freq: Every day | ORAL | Status: DC
  Administered 2012-05-05 – 2012-05-07 (×3): 80 mg via ORAL
  Filled 2012-05-05 (×4): qty 1

## 2012-05-05 MED ORDER — FUROSEMIDE 10 MG/ML IJ SOLN
40.0000 mg | Freq: Once | INTRAMUSCULAR | Status: AC
  Administered 2012-05-05: 40 mg via INTRAVENOUS
  Filled 2012-05-05: qty 4

## 2012-05-05 NOTE — Progress Notes (Signed)
Physical Therapy Treatment Patient Details Name: Tonya [name] MRN: [id number] DOB: [birth year] Today's Date: 05/05/2012 Time: 4098-1191 PT Time Calculation (min): 20 min  PT Assessment / Plan / Recommendation Comments on Treatment Session  pt anxious today, very SOB with O2 desaturation with minimal activity.  Daughter and RN report pt has fluid overload and is receiving medication for it. Feel she would be able to walk if respiratory status was improved    Follow Up Recommendations        Does the patient have the potential to tolerate intense rehabilitation     Barriers to Discharge        Equipment Recommendations       Recommendations for Other Services    Frequency     Plan Discharge plan remains appropriate;Frequency remains appropriate    Precautions / Restrictions Precautions Precaution Comments: monitor sats Restrictions Weight Bearing Restrictions: No   Pertinent Vitals/Pain No c/o pain.  Pt SOB with minimal activity and O2 sats 83% on 3L    Mobility  Transfers Transfers: Sit to Stand;Stand to Sit Sit to Stand: 4: Min guard;With upper extremity assist;From chair/3-in-1 Stand to Sit: 4: Min guard;With upper extremity assist;To chair/3-in-1 Details for Transfer Assistance: pt jittery today "they gave me something for it" daughter reports pt has fluid overload and is having difficulty breathing today  Ambulation/Gait Ambulation/Gait Assistance: 4: Min assist Ambulation Distance (Feet): 3 Feet Assistive device: Rolling walker Ambulation/Gait Assistance Details: pt ambulated 3 feet to get to sink to wash her hands after using the bedside commode with min assist General Gait Details: pt extremely short of breath with activity of standing and washing hands at sink even with 3L supplemental O2    Exercises     PT Diagnosis:    PT Problem List:   PT Treatment Interventions:     PT Goals Acute Rehab PT Goals PT Goal Formulation: With patient/family Time  For Goal Achievement: 05/17/12 Potential to Achieve Goals: Good Pt will go Supine/Side to Sit: Independently;with HOB 0 degrees PT Goal: Supine/Side to Sit - Progress: Progressing toward goal Pt will go Sit to Stand: Independently PT Goal: Sit to Stand - Progress: Progressing toward goal Pt will Ambulate: >150 feet;with modified independence;with least restrictive assistive device Pt will Perform Home Exercise Program: Independently  Visit Information  Last PT Received On: 05/05/12 Assistance Needed: +2    Subjective Data  Subjective: "I'm getting ready for the boneyard" Patient Stated Goal: to get back to bed after lunch   Cognition  Overall Cognitive Status: Appears within functional limits for tasks assessed/performed Arousal/Alertness: Awake/alert Orientation Level: Appears intact for tasks assessed Behavior During Session: [facility] for tasks performed    Balance  Balance Balance Assessed: Yes Static Standing Balance Static Standing - Balance Support: No upper extremity supported Static Standing - Level of Assistance: 5: Stand by assistance Static Standing - Comment/# of Minutes: pt stood at sink to wash her hands, but got very SOB  End of Session PT - End of Session Activity Tolerance: Treatment limited secondary to medical complications (Comment);Other (comment) (pt with sever SOB and decreased O2 sats with activity)   GP     Rosey Bath K. [place], [place] [phone] 05/05/2012, 1:27 PM

## 2012-05-05 NOTE — Progress Notes (Signed)
[name] PROGRESS NOTE  Tonya [name] ZOX:[id number] DOB: Sep 28, 1922 DOA: 05/01/2012 PCP: Tonya Corwin, MD  Assessment/Plan:  *Acute respiratory failure/ PNA (pneumonia)/ COPD:  - This is likely multifactorial secondary to acute COPD exacerbation and pneumonia. She does have an allergy to macrolides.  Started her Levaquin 750 IV, and iv solumedrol. changed to oral prednisone 12/18  Patient received Xopenex and Spiriva and continue her Symbicort.  - sputum cultures revealed normal oropharyngeal flora. Tylenol for fevers.  - Continue Xanax for anxiety as patient seems to be significantly anxious.   As per the daughter she is non compliant to medications and she she has not been on oxygen at ALF. But she will require oxygen on discharge.   HYPERTENSION:  Continue don home ARB  Rash on her arms and back:  possibly eczema. Will require a dermatology consult as outpatient.   Code Status: full  Family Communication: daughter  Disposition Plan: home   Antibiotics:  levaquin  HPI/Subjective: comfortable  Objective: Filed Vitals:   05/05/12 0225 05/05/12 0559 05/05/12 0623 05/05/12 0814  BP:   144/66   Pulse:   88   Temp:   98.2 F (36.8 C)   TempSrc:   Oral   Resp:   18   Height:      Weight:  73.982 kg (163 lb 1.6 oz)    SpO2: 96%  98% 97%    Intake/Output Summary (Last 24 hours) at 05/05/12 0841 Last data filed at 05/05/12 0600  Gross per 24 hour  Intake    480 ml  Output   1500 ml  Net  -1020 ml   Filed Weights   05/02/12 0635 05/03/12 0615 05/05/12 0559  Weight: 66.271 kg (146 lb 1.6 oz) 69.264 kg (152 lb 11.2 oz) 73.982 kg (163 lb 1.6 oz)    Exam:  General: Awake alert and oriented x3 Cardiovascular: Prosthetic heart with a regular rate and rhythm positive S1-S2  Respiratory: Moderate air movement, with wheezing bilaterally and crackles  Abdomen: Positive bowel sounds nontender nondistended soft  Skin: rash on the right arm and  forearm possibly eczema.    Data Reviewed: Basic Metabolic Panel:  Lab 05/02/12 8119 05/01/12 1815  NA 133* 131*  K 3.6 3.9  CL 96 94*  CO2 25 28  GLUCOSE 150* 125*  BUN 19 13  CREATININE 0.88 0.87  CALCIUM 8.4 9.2  MG -- --  PHOS -- --   Liver Function Tests:  Lab 05/02/12 0430  AST 22  ALT 19  ALKPHOS 63  BILITOT 0.6  PROT 5.8*  ALBUMIN 2.7*   No results found for this basename: LIPASE:5,AMYLASE:5 in the last 168 hours No results found for this basename: AMMONIA:5 in the last 168 hours CBC:  Lab 05/04/12 1220 05/02/12 0430 05/01/12 1815  WBC 15.4* 10.0 11.3*  NEUTROABS -- -- 9.0*  HGB 10.4* 10.7* 12.1  HCT 30.5* 30.7* 35.3*  MCV 93.3 91.1 91.2  PLT 162 120* 145*   Cardiac Enzymes:  Lab 05/04/12 1220  CKTOTAL --  CKMB --  CKMBINDEX --  TROPONINI <0.30   BNP (last 3 results) No results found for this basename: PROBNP:3 in the last 8760 hours CBG: No results found for this basename: GLUCAP:5 in the last 168 hours  Recent Results (from the past 240 hour(s))  CULTURE, BLOOD (ROUTINE X 2)     Status: Normal (Preliminary result)   Collection Time   05/01/12 10:40 PM      Component Value Range Status  Comment   Specimen Description BLOOD RIGHT HAND   Final    Special Requests BOTTLES DRAWN AEROBIC AND ANAEROBIC 10CC   Final    Culture  Setup Time 05/02/2012 16:07   Final    Culture     Final    Value:        BLOOD CULTURE RECEIVED NO GROWTH TO DATE CULTURE WILL BE HELD FOR 5 DAYS BEFORE ISSUING A FINAL NEGATIVE REPORT   Report Status PENDING   Incomplete   CULTURE, BLOOD (ROUTINE X 2)     Status: Normal (Preliminary result)   Collection Time   05/01/12 10:45 PM      Component Value Range Status Comment   Specimen Description BLOOD LEFT HAND   Final    Special Requests BOTTLES DRAWN AEROBIC ONLY 5CC   Final    Culture  Setup Time 05/02/2012 16:07   Final    Culture     Final    Value:        BLOOD CULTURE RECEIVED NO GROWTH TO DATE CULTURE WILL BE HELD  FOR 5 DAYS BEFORE ISSUING A FINAL NEGATIVE REPORT   Report Status PENDING   Incomplete   CULTURE, EXPECTORATED SPUTUM-ASSESSMENT     Status: Normal   Collection Time   05/02/12  2:36 PM      Component Value Range Status Comment   Specimen Description SPUTUM   Final    Special Requests Normal   Final    Sputum evaluation     Final    Value: THIS SPECIMEN IS ACCEPTABLE. RESPIRATORY CULTURE REPORT TO FOLLOW.   Report Status 05/02/2012 FINAL   Final   CULTURE, RESPIRATORY     Status: Normal   Collection Time   05/02/12  2:36 PM      Component Value Range Status Comment   Specimen Description SPUTUM   Final    Special Requests NONE   Final    Gram Stain     Final    Value: ABUNDANT WBC PRESENT, PREDOMINANTLY PMN     RARE SQUAMOUS EPITHELIAL CELLS PRESENT     RARE GRAM NEGATIVE RODS     RARE GRAM POSITIVE COCCI IN PAIRS   Culture NORMAL OROPHARYNGEAL FLORA   Final    Report Status 05/04/2012 FINAL   Final      Studies: Dg Chest 2 View  05/04/2012  *RADIOLOGY REPORT*  Clinical Data: Persistent wheezing, shortness of breath.  CHEST - 2 VIEW  Comparison: 05/01/2012  Findings: Interstitial coarsening.  Mild bibasilar opacities, linear.  Heart size upper normal.  Aortic atherosclerosis.  No pleural effusion or pneumothorax.  Multilevel degenerative change.  IMPRESSION: Chronic interstitial changes and COPD.  Lung base opacities are favored to reflect scarring with a mild superimposed atelectasis or infiltrate not excluded.   Original Report Authenticated By: Jearld Lesch, M.D.     Scheduled Meds:    . antiseptic oral rinse  15 mL Mouth Rinse BID  . budesonide-formoterol  2 puff Inhalation BID  . heparin  5,000 Units Subcutaneous Q8H  . latanoprost  1 drop Right Eye QHS  . levalbuterol  0.63 mg Nebulization Q6H  . levofloxacin  750 mg Oral Q48H  . losartan  50 mg Oral Daily  . methylPREDNISolone (SOLU-MEDROL) injection  60 mg Intravenous BID  . montelukast  10 mg Oral QHS  .  tiotropium  18 mcg Inhalation Daily   Continuous Infusions:   Principal Problem:  *Acute respiratory failure Active Problems:  HYPERTENSION  COPD  PNA (pneumonia)        Tonya [name]  [name] Pager [phone]. If 8PM-8AM, please contact night-coverage at [email or web address], password [facility] 05/05/2012, 8:41 AM  LOS: 4 days

## 2012-05-05 NOTE — Progress Notes (Signed)
Occupational Therapy Treatment Patient Details Name: Tonya [name] MRN: [id number] DOB: 11/29/1922 Today's Date: 05/05/2012 Time: 5784-6962 OT Time Calculation (min): 33 min  OT Assessment / Plan / Recommendation Comments on Treatment Session Pt tolerated transfer to [facility] and performing hygiene but does fatigue with exertion. Educated on energy conservation techniques and pt verbalized understanding.    Follow Up Recommendations  Home health OT;Other (comment) [facility] aide for bathing/dressing assist)    Barriers to Discharge       Equipment Recommendations  None recommended by OT    Recommendations for Other Services    Frequency Min 2X/week   Plan Discharge plan needs to be updated    Precautions / Restrictions Precautions Precaution Comments: monitor sats Restrictions Weight Bearing Restrictions: No        ADL  Toilet Transfer: Performed;Min guard Toilet Transfer Method: Stand pivot Toilet Transfer Equipment: Bedside commode Toileting - Clothing Manipulation and Hygiene: Performed;Min guard;Other (comment) (with underwear and clean after urinating and BM) Where Assessed - Toileting Clothing Manipulation and Hygiene: Sit to stand from 3-in-1 or toilet ADL Comments: Pt kept on O2 for duration of session. She states she is fatigued after being up in chair and eating, having bath and then working with OT to transfer on and off BSC. Assisted back to bed. Pt with limited vision due to macular degeneration so she couldnt read energy conservation handout but reviewed techniques with her and left handout and explained family/caregivers could also read over info with her.     OT Diagnosis:    OT Problem List:   OT Treatment Interventions:     OT Goals ADL Goals ADL Goal: Toilet Transfer - Progress: Other (comment) (still at min guard assist) ADL Goal: Toileting - Clothing Manipulation - Progress: Progressing toward goals ADL Goal: Toileting - Hygiene - Progress: Progressing  toward goals  Visit Information  Last OT Received On: 05/05/12 Assistance Needed: +1    Subjective Data  Subjective: I need to get on the commode Patient Stated Goal: wants to get stronger and be more independent   Prior Functioning       Cognition  Overall Cognitive Status: Appears within functional limits for tasks assessed/performed Arousal/Alertness: Awake/alert Orientation Level: Appears intact for tasks assessed Behavior During Session: [facility] for tasks performed    Mobility  Shoulder Instructions Transfers Transfers: Sit to Stand;Stand to Sit Sit to Stand: 4: Min guard;With upper extremity assist;From chair/3-in-1 Stand to Sit: 4: Min guard;With upper extremity assist;To chair/3-in-1       Exercises      Balance     End of Session OT - End of Session Activity Tolerance: Patient limited by fatigue Patient left: in bed;with call bell/phone within reach  GO     Lennox Laity [phone] 05/05/2012, 10:59 AM

## 2012-05-06 MED ORDER — FUROSEMIDE 10 MG/ML IJ SOLN
40.0000 mg | Freq: Once | INTRAMUSCULAR | Status: AC
  Administered 2012-05-06: 40 mg via INTRAVENOUS
  Filled 2012-05-06: qty 4

## 2012-05-06 MED ORDER — FUROSEMIDE 40 MG PO TABS
40.0000 mg | ORAL_TABLET | Freq: Every day | ORAL | Status: DC
  Administered 2012-05-06 – 2012-05-07 (×2): 40 mg via ORAL
  Filled 2012-05-06 (×3): qty 1

## 2012-05-06 NOTE — Progress Notes (Signed)
[name] PROGRESS NOTE  KEONA BILYEU WUJ:[id number] DOB: [birth year] DOA: 05/01/2012 PCP: Nadean Corwin, MD  Assessment/Plan:  *Acute respiratory failure/ PNA (pneumonia)/ COPD:  - This is likely multifactorial secondary to acute COPD exacerbation and pneumonia. She does have an allergy to macrolides.  Started her Levaquin 750 IV, and iv solumedrol. changed to oral prednisone 12/18  Patient received Xopenex and Spiriva and continue her Symbicort.  - sputum cultures revealed normal oropharyngeal flora. Tylenol for fevers.  - Continue Xanax for anxiety as patient seems to be significantly anxious.   As per the daughter she is non compliant to medications and she she has not been on oxygen at ALF. But she will require oxygen on discharge.   HYPERTENSION:  Continue don home ARB  Rash on her arms and back:  possibly eczema. Will require a dermatology consult as outpatient.   Code Status: full  Family Communication: daughter  Disposition Plan: home with 24 hrs home care  Antibiotics:  levaquin  HPI/Subjective: Dyspnea with minimal activity   Objective: Filed Vitals:   05/05/12 1955 05/05/12 2105 05/06/12 0625 05/06/12 0952  BP:  136/59 143/61   Pulse:  85 75   Temp:  98.5 F (36.9 C) 98.1 F (36.7 C)   TempSrc:  Oral Oral   Resp:  16 16   Height:      Weight:      SpO2: 97% 95% 98% 95%    Intake/Output Summary (Last 24 hours) at 05/06/12 1425 Last data filed at 05/06/12 1400  Gross per 24 hour  Intake    480 ml  Output   2400 ml  Net  -1920 ml   Filed Weights   05/02/12 0635 05/03/12 0615 05/05/12 0559  Weight: 66.271 kg (146 lb 1.6 oz) 69.264 kg (152 lb 11.2 oz) 73.982 kg (163 lb 1.6 oz)    Exam:  General: Awake alert and oriented x3 Cardiovascular: regular  Respiratory: Good air movement, with decreased wheezing bilaterally and diminished crackles  Abdomen: Positive bowel sounds nontender nondistended soft  Skin: rash on the right arm  and forearm possibly eczema.    Data Reviewed: Basic Metabolic Panel:  Lab 05/02/12 9562 05/01/12 1815  NA 133* 131*  K 3.6 3.9  CL 96 94*  CO2 25 28  GLUCOSE 150* 125*  BUN 19 13  CREATININE 0.88 0.87  CALCIUM 8.4 9.2  MG -- --  PHOS -- --   Liver Function Tests:  Lab 05/02/12 0430  AST 22  ALT 19  ALKPHOS 63  BILITOT 0.6  PROT 5.8*  ALBUMIN 2.7*   No results found for this basename: LIPASE:5,AMYLASE:5 in the last 168 hours No results found for this basename: AMMONIA:5 in the last 168 hours CBC:  Lab 05/04/12 1220 05/02/12 0430 05/01/12 1815  WBC 15.4* 10.0 11.3*  NEUTROABS -- -- 9.0*  HGB 10.4* 10.7* 12.1  HCT 30.5* 30.7* 35.3*  MCV 93.3 91.1 91.2  PLT 162 120* 145*   Cardiac Enzymes:  Lab 05/04/12 1220  CKTOTAL --  CKMB --  CKMBINDEX --  TROPONINI <0.30   BNP (last 3 results) No results found for this basename: PROBNP:3 in the last 8760 hours CBG: No results found for this basename: GLUCAP:5 in the last 168 hours  Recent Results (from the past 240 hour(s))  CULTURE, BLOOD (ROUTINE X 2)     Status: Normal (Preliminary result)   Collection Time   05/01/12 10:40 PM      Component Value Range Status Comment  Specimen Description BLOOD RIGHT HAND   Final    Special Requests BOTTLES DRAWN AEROBIC AND ANAEROBIC 10CC   Final    Culture  Setup Time 05/02/2012 16:07   Final    Culture     Final    Value:        BLOOD CULTURE RECEIVED NO GROWTH TO DATE CULTURE WILL BE HELD FOR 5 DAYS BEFORE ISSUING A FINAL NEGATIVE REPORT   Report Status PENDING   Incomplete   CULTURE, BLOOD (ROUTINE X 2)     Status: Normal (Preliminary result)   Collection Time   05/01/12 10:45 PM      Component Value Range Status Comment   Specimen Description BLOOD LEFT HAND   Final    Special Requests BOTTLES DRAWN AEROBIC ONLY 5CC   Final    Culture  Setup Time 05/02/2012 16:07   Final    Culture     Final    Value:        BLOOD CULTURE RECEIVED NO GROWTH TO DATE CULTURE WILL BE  HELD FOR 5 DAYS BEFORE ISSUING A FINAL NEGATIVE REPORT   Report Status PENDING   Incomplete   CULTURE, EXPECTORATED SPUTUM-ASSESSMENT     Status: Normal   Collection Time   05/02/12  2:36 PM      Component Value Range Status Comment   Specimen Description SPUTUM   Final    Special Requests Normal   Final    Sputum evaluation     Final    Value: THIS SPECIMEN IS ACCEPTABLE. RESPIRATORY CULTURE REPORT TO FOLLOW.   Report Status 05/02/2012 FINAL   Final   CULTURE, RESPIRATORY     Status: Normal   Collection Time   05/02/12  2:36 PM      Component Value Range Status Comment   Specimen Description SPUTUM   Final    Special Requests NONE   Final    Gram Stain     Final    Value: ABUNDANT WBC PRESENT, PREDOMINANTLY PMN     RARE SQUAMOUS EPITHELIAL CELLS PRESENT     RARE GRAM NEGATIVE RODS     RARE GRAM POSITIVE COCCI IN PAIRS   Culture NORMAL OROPHARYNGEAL FLORA   Final    Report Status 05/04/2012 FINAL   Final      Studies: Dg Chest 2 View  05/04/2012  *RADIOLOGY REPORT*  Clinical Data: Persistent wheezing, shortness of breath.  CHEST - 2 VIEW  Comparison: 05/01/2012  Findings: Interstitial coarsening.  Mild bibasilar opacities, linear.  Heart size upper normal.  Aortic atherosclerosis.  No pleural effusion or pneumothorax.  Multilevel degenerative change.  IMPRESSION: Chronic interstitial changes and COPD.  Lung base opacities are favored to reflect scarring with a mild superimposed atelectasis or infiltrate not excluded.   Original Report Authenticated By: Jearld Lesch, M.D.     Scheduled Meds:    . antiseptic oral rinse  15 mL Mouth Rinse BID  . budesonide-formoterol  2 puff Inhalation BID  . furosemide  40 mg Intravenous Once  . furosemide  40 mg Oral Daily  . heparin  5,000 Units Subcutaneous Q8H  . latanoprost  1 drop Right Eye QHS  . levalbuterol  0.63 mg Nebulization Q6H  . levofloxacin  750 mg Oral Q48H  . losartan  50 mg Oral Daily  . montelukast  10 mg Oral QHS   . predniSONE  80 mg Oral Q breakfast  . tiotropium  18 mcg Inhalation Daily   Continuous Infusions:  Principal Problem:  *Acute respiratory failure Active Problems:  HYPERTENSION  COPD  PNA (pneumonia)        Jilleen Essner  [name] Pager [phone]. If 8PM-8AM, please contact night-coverage at [email or web address], password [email or web address] 05/06/2012, 2:25 PM  LOS: 5 days

## 2012-05-07 MED ORDER — FUROSEMIDE 40 MG PO TABS: 40.0000 mg | ORAL_TABLET | Freq: Every day | ORAL | Status: DC

## 2012-05-07 MED ORDER — PREDNISONE (PAK) 10 MG PO TABS: 10.0000 mg | ORAL_TABLET | ORAL | Status: DC

## 2012-05-07 MED ORDER — TIOTROPIUM BROMIDE MONOHYDRATE 18 MCG IN CAPS: 18.0000 ug | ORAL_CAPSULE | Freq: Every day | RESPIRATORY_TRACT | Status: DC

## 2012-05-07 MED ORDER — LEVALBUTEROL HCL 0.63 MG/3ML IN NEBU: 0.6300 mg | INHALATION_SOLUTION | Freq: Three times a day (TID) | RESPIRATORY_TRACT | Status: DC

## 2012-05-07 MED ORDER — LEVALBUTEROL HCL 0.63 MG/3ML IN NEBU
0.6300 mg | INHALATION_SOLUTION | Freq: Four times a day (QID) | RESPIRATORY_TRACT | Status: DC
  Administered 2012-05-07: 0.63 mg via RESPIRATORY_TRACT
  Filled 2012-05-07 (×5): qty 3

## 2012-05-07 MED ORDER — LEVALBUTEROL HCL 0.63 MG/3ML IN NEBU
0.6300 mg | INHALATION_SOLUTION | RESPIRATORY_TRACT | Status: DC | PRN
  Filled 2012-05-07: qty 3

## 2012-05-07 NOTE — Progress Notes (Addendum)
Pt to be discharged back to [facility] today. IV is out, pt is in NAD, assessment is unchanged from this morning. Pt will be transported to facility by mother. All paperwork has been gone over and is with patient.

## 2012-05-07 NOTE — Progress Notes (Signed)
Please document sats as shown below per Medicare Guidelines.    SATURATION QUALIFICATIONS: (This note is used to comply with regulatory documentation for home oxygen)  Patient Saturations on Room Air at Rest = ___%  Patient Saturations on Room Air while Ambulating = ___%  Patient Saturations on ___ Liters of oxygen while Ambulating = ___%  Please briefly explain why patient needs home oxygen:

## 2012-05-07 NOTE — Progress Notes (Signed)
Patient Saturations on Room Air at Rest = _88__%  Patient Saturations on [employer] while Ambulating = _86__%  Patient Saturations on ___ Liters of oxygen while Ambulating = _97__%  Please briefly explain why patient needs home oxygen:

## 2012-05-07 NOTE — Discharge Summary (Signed)
Physician Discharge Summary  KOA PALLA YQM:[id number] DOB: 11-13-1922 DOA: 05/01/2012  PCP: Nadean Corwin, MD  Admit date: 05/01/2012 Discharge date: 05/07/2012  Time spent: 35 minutes   Discharge Diagnoses:  Acute on chronic respiratory failure  HYPERTENSION  COPD excaerbation   PNA (pneumonia) Acute congestive heart failure from iv fluids - resolved after iv diuresis [place] [facility]   Discharge Condition: fair   Diet recommendation: heart healthy   Filed Weights   05/03/12 0615 05/05/12 0559 05/07/12 0243  Weight: 69.264 kg (152 lb 11.2 oz) 73.982 kg (163 lb 1.6 oz) 69.4 kg (153 lb)    History of present illness:  Past medical history of COPD comes in for cough and shortness of breath 5 days prior to admission presently getting worse to the point where she can even walk to the bathroom without getting short of breath. She was changed prior to admission she started having productive cough and pink in color. She hasn't had a fever but here in the emergency room the temperature was checked this showed a temperature of 100.5. We were asked to admit and further evaluate.   Hospital Course:  1. Acute on chronic respiratory failure - multifactorial from pneumonia, COPD exacerbation and CHF exacerbation. The patient was admitted to [facility] hospital started on IV steroids and IV antibiotics and bronchodilators around-the-clock. She had a course of very slow recovery and also required IV Lasix for improvement of her respiratory status. By the time of discharge she is stabilized, she will be prescribed a prednisone taper and bronchodilators./ The patient has completed one week of antibiotics in the hospital. The patient will be switched back to oral diuretics at the time of the discharge. The patient is discharged home on oxygen supplementation and we will recommend she follows up with a pulmonary physician      Discharge Exam: Filed Vitals:    05/06/12 2225 05/07/12 0243 05/07/12 0522 05/07/12 0849  BP: 110/48  112/64   Pulse: 59  72   Temp: 98 F (36.7 C)  98.1 F (36.7 C)   TempSrc: Oral  Oral   Resp: 16  18   Height:      Weight:  69.4 kg (153 lb)    SpO2: 98%  98% 96%    General: axox3 Cardiovascular: rrr, no m,r,g  Respiratory: minimal rhonchi, no wheezec no crackles   Discharge Instructions  Discharge Orders    Future Orders Please Complete By Expires   Diet - low sodium heart healthy      Increase activity slowly          Medication List     As of 05/07/2012  9:10 AM    TAKE these medications         ALPRAZolam 0.5 MG tablet   Commonly known as: XANAX   Take 0.25-0.5 mg by mouth 3 (three) times daily as needed. For anxiety      beta carotene w/minerals tablet   Take 1 tablet by mouth daily.      budesonide-formoterol 160-4.5 MCG/ACT inhaler   Commonly known as: SYMBICORT   Inhale 2 puffs into the lungs 2 (two) times daily.      furosemide 40 MG tablet   Commonly known as: LASIX   Take 1 tablet (40 mg total) by mouth daily.      latanoprost 0.005 % ophthalmic solution   Commonly known as: XALATAN   Place 1 drop into the right eye at bedtime.  levalbuterol 0.63 MG/3ML nebulizer solution   Commonly known as: XOPENEX   Take 3 mLs (0.63 mg total) by nebulization 3 (three) times daily.      losartan 50 MG tablet   Commonly known as: COZAAR   Take 50 mg by mouth daily.      predniSONE 10 MG tablet   Commonly known as: STERAPRED UNI-PAK   Take 1 tablet (10 mg total) by mouth See admin instructions.      tiotropium 18 MCG inhalation capsule   Commonly known as: SPIRIVA   Place 1 capsule (18 mcg total) into inhaler and inhale daily.      zafirlukast 20 MG tablet   Commonly known as: ACCOLATE   Take 20 mg by mouth 2 (two) times daily.           Follow-up Information    Schedule an appointment as soon as possible for a visit with Nadean Corwin, MD.   Contact information:    [address] [place] [address] [phone]           The results of significant diagnostics from this hospitalization (including imaging, microbiology, ancillary and laboratory) are listed below for reference.    Significant Diagnostic Studies: Dg Chest 2 View  05/04/2012  *RADIOLOGY REPORT*  Clinical Data: Persistent wheezing, shortness of breath.  CHEST - 2 VIEW  Comparison: 05/01/2012  Findings: Interstitial coarsening.  Mild bibasilar opacities, linear.  Heart size upper normal.  Aortic atherosclerosis.  No pleural effusion or pneumothorax.  Multilevel degenerative change.  IMPRESSION: Chronic interstitial changes and COPD.  Lung base opacities are favored to reflect scarring with a mild superimposed atelectasis or infiltrate not excluded.   Original Report Authenticated By: Jearld Lesch, M.D.    Dg Chest 2 View  05/01/2012  *RADIOLOGY REPORT*  Clinical Data: Effective cough, history bronchitis, emphysema  CHEST - 2 VIEW  Comparison: 06/07/1999  Findings: Upper-normal size of cardiac silhouette. Tract calcification aorta. Mediastinal contours and pulmonary vascularity grossly normal. Emphysematous and bronchitic changes consistent with COPD. Slight accentuation of perihilar interstitial markings versus previous exam, of uncertain acuity. No segmental infiltrate, pleural effusion, or pneumothorax. Bones diffusely demineralized.  IMPRESSION: Changes of COPD/chronic bronchitis. Minimal diffuse interstitial prominence increased since 2011 without definite focal consolidation.   Original Report Authenticated By: Ulyses Southward, M.D.     Microbiology: Recent Results (from the past 240 hour(s))  CULTURE, BLOOD (ROUTINE X 2)     Status: Normal (Preliminary result)   Collection Time   05/01/12 10:40 PM      Component Value Range Status Comment   Specimen Description BLOOD RIGHT HAND   Final    Special Requests BOTTLES DRAWN AEROBIC AND ANAEROBIC 10CC   Final    Culture   Setup Time 05/02/2012 16:07   Final    Culture     Final    Value:        BLOOD CULTURE RECEIVED NO GROWTH TO DATE CULTURE WILL BE HELD FOR 5 DAYS BEFORE ISSUING A FINAL NEGATIVE REPORT   Report Status PENDING   Incomplete   CULTURE, BLOOD (ROUTINE X 2)     Status: Normal (Preliminary result)   Collection Time   05/01/12 10:45 PM      Component Value Range Status Comment   Specimen Description BLOOD LEFT HAND   Final    Special Requests BOTTLES DRAWN AEROBIC ONLY 5CC   Final    Culture  Setup Time 05/02/2012 16:07   Final    Culture  Final    Value:        BLOOD CULTURE RECEIVED NO GROWTH TO DATE CULTURE WILL BE HELD FOR 5 DAYS BEFORE ISSUING A FINAL NEGATIVE REPORT   Report Status PENDING   Incomplete   CULTURE, EXPECTORATED SPUTUM-ASSESSMENT     Status: Normal   Collection Time   05/02/12  2:36 PM      Component Value Range Status Comment   Specimen Description SPUTUM   Final    Special Requests Normal   Final    Sputum evaluation     Final    Value: THIS SPECIMEN IS ACCEPTABLE. RESPIRATORY CULTURE REPORT TO FOLLOW.   Report Status 05/02/2012 FINAL   Final   CULTURE, RESPIRATORY     Status: Normal   Collection Time   05/02/12  2:36 PM      Component Value Range Status Comment   Specimen Description SPUTUM   Final    Special Requests NONE   Final    Gram Stain     Final    Value: ABUNDANT WBC PRESENT, PREDOMINANTLY PMN     RARE SQUAMOUS EPITHELIAL CELLS PRESENT     RARE GRAM NEGATIVE RODS     RARE GRAM POSITIVE COCCI IN PAIRS   Culture NORMAL OROPHARYNGEAL FLORA   Final    Report Status 05/04/2012 FINAL   Final      Labs: Basic Metabolic Panel:  Lab 05/02/12 1478 05/01/12 1815  NA 133* 131*  K 3.6 3.9  CL 96 94*  CO2 25 28  GLUCOSE 150* 125*  BUN 19 13  CREATININE 0.88 0.87  CALCIUM 8.4 9.2  MG -- --  PHOS -- --   Liver Function Tests:  Lab 05/02/12 0430  AST 22  ALT 19  ALKPHOS 63  BILITOT 0.6  PROT 5.8*  ALBUMIN 2.7*   No results found for this  basename: LIPASE:5,AMYLASE:5 in the last 168 hours No results found for this basename: AMMONIA:5 in the last 168 hours CBC:  Lab 05/04/12 1220 05/02/12 0430 05/01/12 1815  WBC 15.4* 10.0 11.3*  NEUTROABS -- -- 9.0*  HGB 10.4* 10.7* 12.1  HCT 30.5* 30.7* 35.3*  MCV 93.3 91.1 91.2  PLT 162 120* 145*   Cardiac Enzymes:  Lab 05/04/12 1220  CKTOTAL --  CKMB --  CKMBINDEX --  TROPONINI <0.30   BNP: BNP (last 3 results) No results found for this basename: PROBNP:3 in the last 8760 hours CBG: No results found for this basename: GLUCAP:5 in the last 168 hours     Signed:  Callee Rohrig  [name] 05/07/2012, 9:10 AM

## 2012-05-08 LAB — CULTURE, BLOOD (ROUTINE X 2)
Culture: NO GROWTH
Culture: NO GROWTH

## 2013-04-13 ENCOUNTER — Telehealth: Payer: Self-pay | Admitting: Internal Medicine

## 2013-04-13 ENCOUNTER — Other Ambulatory Visit: Payer: Self-pay | Admitting: Internal Medicine

## 2013-04-13 DIAGNOSIS — J209 Acute bronchitis, unspecified: Secondary | ICD-10-CM

## 2013-04-13 MED ORDER — AZITHROMYCIN 250 MG PO TABS: ORAL_TABLET | ORAL | Status: AC

## 2013-04-13 NOTE — Telephone Encounter (Signed)
Pt. Is requested zpack for COPD  PLEASE ADVISE PHARM: [facility] [place]

## 2013-04-22 ENCOUNTER — Telehealth: Payer: Self-pay | Admitting: *Deleted

## 2013-04-22 NOTE — Telephone Encounter (Signed)
Spoke with pt regarding O2and gave instructions per Dr Evern Bio

## 2013-04-22 NOTE — Telephone Encounter (Signed)
Pt is calling & wants to know can she turn her O2 up to 4lpm? i asked what she was on now she said 3lpm but i also asked did she turn it up to 3 or did someone tell her to? She says she did it because she feels shes not getting enough O2? Please advise pt

## 2013-04-22 NOTE — Telephone Encounter (Signed)
Suggest she get / use a pulse OX and if O2 is below 90 - then she can turn up to 4 liters O2  But it is dangerous to turn up too high if her oxygen is over 92

## 2013-06-03 ENCOUNTER — Inpatient Hospital Stay (HOSPITAL_COMMUNITY)
Admission: EM | Admit: 2013-06-03 | Discharge: 2013-06-10 | DRG: 189 | Disposition: A | Discharge status: Hospice | Payer: Medicare Other | Attending: Internal Medicine | Admitting: Internal Medicine

## 2013-06-03 ENCOUNTER — Emergency Department (HOSPITAL_COMMUNITY): Payer: Medicare Other

## 2013-06-03 ENCOUNTER — Encounter (HOSPITAL_COMMUNITY): Payer: Self-pay | Admitting: Emergency Medicine

## 2013-06-03 DIAGNOSIS — Z66 Do not resuscitate: Secondary | ICD-10-CM | POA: Diagnosis present

## 2013-06-03 DIAGNOSIS — R062 Wheezing: Secondary | ICD-10-CM | POA: Diagnosis present

## 2013-06-03 DIAGNOSIS — J9600 Acute respiratory failure, unspecified whether with hypoxia or hypercapnia: Secondary | ICD-10-CM

## 2013-06-03 DIAGNOSIS — Z87891 Personal history of nicotine dependence: Secondary | ICD-10-CM

## 2013-06-03 DIAGNOSIS — T380X5A Adverse effect of glucocorticoids and synthetic analogues, initial encounter: Secondary | ICD-10-CM | POA: Diagnosis present

## 2013-06-03 DIAGNOSIS — E440 Moderate protein-calorie malnutrition: Secondary | ICD-10-CM | POA: Diagnosis present

## 2013-06-03 DIAGNOSIS — Z515 Encounter for palliative care: Secondary | ICD-10-CM

## 2013-06-03 DIAGNOSIS — G252 Other specified forms of tremor: Secondary | ICD-10-CM

## 2013-06-03 DIAGNOSIS — D72829 Elevated white blood cell count, unspecified: Secondary | ICD-10-CM | POA: Diagnosis present

## 2013-06-03 DIAGNOSIS — I4892 Unspecified atrial flutter: Secondary | ICD-10-CM

## 2013-06-03 DIAGNOSIS — Z823 Family history of stroke: Secondary | ICD-10-CM

## 2013-06-03 DIAGNOSIS — IMO0002 Reserved for concepts with insufficient information to code with codable children: Secondary | ICD-10-CM

## 2013-06-03 DIAGNOSIS — I4891 Unspecified atrial fibrillation: Secondary | ICD-10-CM

## 2013-06-03 DIAGNOSIS — J449 Chronic obstructive pulmonary disease, unspecified: Secondary | ICD-10-CM

## 2013-06-03 DIAGNOSIS — I10 Essential (primary) hypertension: Secondary | ICD-10-CM

## 2013-06-03 DIAGNOSIS — R000 Tachycardia, unspecified: Secondary | ICD-10-CM | POA: Diagnosis present

## 2013-06-03 DIAGNOSIS — G250 Essential tremor: Secondary | ICD-10-CM

## 2013-06-03 DIAGNOSIS — I5031 Acute diastolic (congestive) heart failure: Secondary | ICD-10-CM | POA: Diagnosis present

## 2013-06-03 DIAGNOSIS — J9620 Acute and chronic respiratory failure, unspecified whether with hypoxia or hypercapnia: Principal | ICD-10-CM | POA: Diagnosis present

## 2013-06-03 DIAGNOSIS — R5381 Other malaise: Secondary | ICD-10-CM | POA: Diagnosis present

## 2013-06-03 DIAGNOSIS — J441 Chronic obstructive pulmonary disease with (acute) exacerbation: Secondary | ICD-10-CM

## 2013-06-03 DIAGNOSIS — R5383 Other fatigue: Secondary | ICD-10-CM

## 2013-06-03 DIAGNOSIS — E871 Hypo-osmolality and hyponatremia: Secondary | ICD-10-CM

## 2013-06-03 DIAGNOSIS — I509 Heart failure, unspecified: Secondary | ICD-10-CM | POA: Diagnosis present

## 2013-06-03 DIAGNOSIS — D696 Thrombocytopenia, unspecified: Secondary | ICD-10-CM

## 2013-06-03 DIAGNOSIS — D649 Anemia, unspecified: Secondary | ICD-10-CM

## 2013-06-03 DIAGNOSIS — J4489 Other specified chronic obstructive pulmonary disease: Secondary | ICD-10-CM

## 2013-06-03 DIAGNOSIS — Z9981 Dependence on supplemental oxygen: Secondary | ICD-10-CM

## 2013-06-03 DIAGNOSIS — I872 Venous insufficiency (chronic) (peripheral): Secondary | ICD-10-CM

## 2013-06-03 DIAGNOSIS — R509 Fever, unspecified: Secondary | ICD-10-CM | POA: Diagnosis present

## 2013-06-03 DIAGNOSIS — F411 Generalized anxiety disorder: Secondary | ICD-10-CM | POA: Diagnosis present

## 2013-06-03 DIAGNOSIS — Z7982 Long term (current) use of aspirin: Secondary | ICD-10-CM

## 2013-06-03 LAB — BLOOD GAS, ARTERIAL
ACID-BASE EXCESS: 4.6 mmol/L — AB (ref 0.0–2.0)
Bicarbonate: 30.2 mEq/L — ABNORMAL HIGH (ref 20.0–24.0)
DRAWN BY: 347641
FIO2: 0.44 %
O2 Saturation: 98.8 %
PATIENT TEMPERATURE: 99.1
PCO2 ART: 52.7 mmHg — AB (ref 35.0–45.0)
TCO2: 27.6 mmol/L (ref 0–100)
pH, Arterial: 7.378 (ref 7.350–7.450)
pO2, Arterial: 145 mmHg — ABNORMAL HIGH (ref 80.0–100.0)

## 2013-06-03 LAB — COMPREHENSIVE METABOLIC PANEL
ALBUMIN: 3.7 g/dL (ref 3.5–5.2)
ALT: 23 U/L (ref 0–35)
ALT: 20 U/L (ref 0–35)
AST: 34 U/L (ref 0–37)
AST: 21 U/L (ref 0–37)
Albumin: 3.2 g/dL — ABNORMAL LOW (ref 3.5–5.2)
Alkaline Phosphatase: 62 U/L (ref 39–117)
Alkaline Phosphatase: 58 U/L (ref 39–117)
BILIRUBIN TOTAL: 0.4 mg/dL (ref 0.3–1.2)
BUN: 13 mg/dL (ref 6–23)
BUN: 13 mg/dL (ref 6–23)
CALCIUM: 9.2 mg/dL (ref 8.4–10.5)
CHLORIDE: 95 meq/L — AB (ref 96–112)
CO2: 28 meq/L (ref 19–32)
CO2: 29 meq/L (ref 19–32)
CREATININE: 0.69 mg/dL (ref 0.50–1.10)
CREATININE: 0.75 mg/dL (ref 0.50–1.10)
Calcium: 8.9 mg/dL (ref 8.4–10.5)
Chloride: 96 mEq/L (ref 96–112)
GFR calc Af Amer: 87 mL/min — ABNORMAL LOW (ref >90)
GFR calc Af Amer: 85 mL/min — ABNORMAL LOW (ref >90)
GFR calc non Af Amer: 73 mL/min — ABNORMAL LOW (ref >90)
GFR, EST NON AFRICAN AMERICAN: 75 mL/min — AB (ref >90)
GLUCOSE: 189 mg/dL — AB (ref 70–99)
Glucose, Bld: 151 mg/dL — ABNORMAL HIGH (ref 70–99)
Potassium: 4.5 mEq/L (ref 3.7–5.3)
Potassium: 3.8 mEq/L (ref 3.7–5.3)
Sodium: 136 mEq/L — ABNORMAL LOW (ref 137–147)
Sodium: 137 mEq/L (ref 137–147)
Total Bilirubin: 0.4 mg/dL (ref 0.3–1.2)
Total Protein: 6.9 g/dL (ref 6.0–8.3)
Total Protein: 6.2 g/dL (ref 6.0–8.3)

## 2013-06-03 LAB — CBC WITH DIFFERENTIAL/PLATELET
BASOS ABS: 0 10*3/uL (ref 0.0–0.1)
BASOS PCT: 0 % (ref 0–1)
EOS PCT: 1 % (ref 0–5)
Eosinophils Absolute: 0.1 10*3/uL (ref 0.0–0.7)
HEMATOCRIT: 34.4 % — AB (ref 36.0–46.0)
HEMOGLOBIN: 11.7 g/dL — AB (ref 12.0–15.0)
Lymphocytes Relative: 8 % — ABNORMAL LOW (ref 12–46)
Lymphs Abs: 0.9 10*3/uL (ref 0.7–4.0)
MCH: 31.9 pg (ref 26.0–34.0)
MCHC: 34 g/dL (ref 30.0–36.0)
MCV: 93.7 fL (ref 78.0–100.0)
MONO ABS: 0.8 10*3/uL (ref 0.1–1.0)
MONOS PCT: 7 % (ref 3–12)
Neutro Abs: 9.5 10*3/uL — ABNORMAL HIGH (ref 1.7–7.7)
Neutrophils Relative %: 84 % — ABNORMAL HIGH (ref 43–77)
Platelets: 148 10*3/uL — ABNORMAL LOW (ref 150–400)
RBC: 3.67 MIL/uL — ABNORMAL LOW (ref 3.87–5.11)
RDW: 12.9 % (ref 11.5–15.5)
WBC: 11.3 10*3/uL — ABNORMAL HIGH (ref 4.0–10.5)

## 2013-06-03 LAB — CBC
HCT: 30.6 % — ABNORMAL LOW (ref 36.0–46.0)
HEMOGLOBIN: 10.2 g/dL — AB (ref 12.0–15.0)
MCH: 31.6 pg (ref 26.0–34.0)
MCHC: 33.3 g/dL (ref 30.0–36.0)
MCV: 94.7 fL (ref 78.0–100.0)
Platelets: 121 10*3/uL — ABNORMAL LOW (ref 150–400)
RBC: 3.23 MIL/uL — AB (ref 3.87–5.11)
RDW: 12.9 % (ref 11.5–15.5)
WBC: 10.2 10*3/uL (ref 4.0–10.5)

## 2013-06-03 LAB — INFLUENZA PANEL BY PCR (TYPE A & B)
H1N1FLUPCR: NOT DETECTED
Influenza A By PCR: NEGATIVE
Influenza B By PCR: NEGATIVE

## 2013-06-03 LAB — PRO B NATRIURETIC PEPTIDE: PRO B NATRI PEPTIDE: 331.7 pg/mL (ref 0–450)

## 2013-06-03 LAB — MRSA PCR SCREENING: MRSA by PCR: NEGATIVE

## 2013-06-03 LAB — PROTIME-INR
INR: 1.05 (ref 0.00–1.49)
Prothrombin Time: 13.5 seconds (ref 11.6–15.2)

## 2013-06-03 LAB — TROPONIN I

## 2013-06-03 MED ORDER — IPRATROPIUM BROMIDE 0.02 % IN SOLN: 0.5000 mg | RESPIRATORY_TRACT | Status: DC

## 2013-06-03 MED ORDER — METOPROLOL TARTRATE 1 MG/ML IV SOLN
5.0000 mg | INTRAVENOUS | Status: DC | PRN
  Filled 2013-06-03: qty 5

## 2013-06-03 MED ORDER — ASPIRIN EC 81 MG PO TBEC
81.0000 mg | DELAYED_RELEASE_TABLET | Freq: Every day | ORAL | Status: DC
  Administered 2013-06-03 – 2013-06-10 (×8): 81 mg via ORAL
  Filled 2013-06-03 (×9): qty 1

## 2013-06-03 MED ORDER — ALBUTEROL SULFATE (2.5 MG/3ML) 0.083% IN NEBU: 2.5000 mg | INHALATION_SOLUTION | RESPIRATORY_TRACT | Status: AC | PRN

## 2013-06-03 MED ORDER — GUAIFENESIN ER 600 MG PO TB12
600.0000 mg | ORAL_TABLET | Freq: Two times a day (BID) | ORAL | Status: DC
  Administered 2013-06-03 – 2013-06-05 (×6): 600 mg via ORAL
  Filled 2013-06-03 (×7): qty 1

## 2013-06-03 MED ORDER — OSELTAMIVIR PHOSPHATE 75 MG PO CAPS
75.0000 mg | ORAL_CAPSULE | Freq: Two times a day (BID) | ORAL | Status: DC
  Administered 2013-06-03: 75 mg via ORAL
  Filled 2013-06-03 (×4): qty 1

## 2013-06-03 MED ORDER — ACETAMINOPHEN 650 MG RE SUPP
650.0000 mg | Freq: Four times a day (QID) | RECTAL | Status: DC | PRN
Start: 2013-06-03 — End: 2013-06-10

## 2013-06-03 MED ORDER — ACETAMINOPHEN 325 MG PO TABS: 650.0000 mg | ORAL_TABLET | Freq: Four times a day (QID) | ORAL | Status: DC | PRN

## 2013-06-03 MED ORDER — LEVOFLOXACIN IN D5W 750 MG/150ML IV SOLN
750.0000 mg | INTRAVENOUS | Status: DC
  Administered 2013-06-05: 750 mg via INTRAVENOUS
  Filled 2013-06-03: qty 150

## 2013-06-03 MED ORDER — ALBUTEROL SULFATE (2.5 MG/3ML) 0.083% IN NEBU: 2.5000 mg | INHALATION_SOLUTION | Freq: Four times a day (QID) | RESPIRATORY_TRACT | Status: DC | PRN

## 2013-06-03 MED ORDER — BUDESONIDE-FORMOTEROL FUMARATE 160-4.5 MCG/ACT IN AERO
2.0000 | INHALATION_SPRAY | Freq: Two times a day (BID) | RESPIRATORY_TRACT | Status: DC
  Administered 2013-06-03 – 2013-06-06 (×7): 2 via RESPIRATORY_TRACT
  Filled 2013-06-03: qty 6

## 2013-06-03 MED ORDER — ALBUTEROL SULFATE (5 MG/ML) 0.5% IN NEBU
2.5000 mg | INHALATION_SOLUTION | RESPIRATORY_TRACT | Status: DC
  Filled 2013-06-03: qty 0.5

## 2013-06-03 MED ORDER — ENOXAPARIN SODIUM 40 MG/0.4ML ~~LOC~~ SOLN
40.0000 mg | SUBCUTANEOUS | Status: DC
  Administered 2013-06-03 – 2013-06-10 (×8): 40 mg via SUBCUTANEOUS
  Filled 2013-06-03 (×8): qty 0.4

## 2013-06-03 MED ORDER — METHYLPREDNISOLONE SODIUM SUCC 125 MG IJ SOLR
60.0000 mg | Freq: Four times a day (QID) | INTRAMUSCULAR | Status: DC
  Administered 2013-06-03 – 2013-06-04 (×5): 60 mg via INTRAVENOUS
  Filled 2013-06-03 (×10): qty 0.96

## 2013-06-03 MED ORDER — ALPRAZOLAM 0.5 MG PO TABS
0.5000 mg | ORAL_TABLET | Freq: Three times a day (TID) | ORAL | Status: DC | PRN
  Administered 2013-06-03 – 2013-06-04 (×4): 0.5 mg via ORAL
  Filled 2013-06-03 (×4): qty 1

## 2013-06-03 MED ORDER — ONDANSETRON HCL 4 MG/2ML IJ SOLN
4.0000 mg | Freq: Once | INTRAMUSCULAR | Status: AC
  Administered 2013-06-03: 4 mg via INTRAVENOUS
  Filled 2013-06-03: qty 2

## 2013-06-03 MED ORDER — SODIUM CHLORIDE 0.9 % IJ SOLN
3.0000 mL | Freq: Two times a day (BID) | INTRAMUSCULAR | Status: DC
  Administered 2013-06-03 – 2013-06-09 (×14): 3 mL via INTRAVENOUS

## 2013-06-03 MED ORDER — ALBUTEROL (5 MG/ML) CONTINUOUS INHALATION SOLN
10.0000 mg/h | INHALATION_SOLUTION | RESPIRATORY_TRACT | Status: DC
  Administered 2013-06-03: 10 mg/h via RESPIRATORY_TRACT
  Filled 2013-06-03: qty 20

## 2013-06-03 MED ORDER — METHYLPREDNISOLONE SODIUM SUCC 125 MG IJ SOLR
125.0000 mg | Freq: Once | INTRAMUSCULAR | Status: AC
  Administered 2013-06-03: 125 mg via INTRAVENOUS
  Filled 2013-06-03: qty 2

## 2013-06-03 MED ORDER — LEVOFLOXACIN IN D5W 750 MG/150ML IV SOLN
750.0000 mg | Freq: Once | INTRAVENOUS | Status: AC
  Administered 2013-06-03: 750 mg via INTRAVENOUS
  Filled 2013-06-03: qty 150

## 2013-06-03 MED ORDER — SIMVASTATIN 40 MG PO TABS
40.0000 mg | ORAL_TABLET | Freq: Every day | ORAL | Status: DC
  Administered 2013-06-03 – 2013-06-06 (×4): 40 mg via ORAL
  Filled 2013-06-03 (×5): qty 1

## 2013-06-03 MED ORDER — FUROSEMIDE 40 MG PO TABS
40.0000 mg | ORAL_TABLET | Freq: Every day | ORAL | Status: DC
  Administered 2013-06-03: 40 mg via ORAL
  Filled 2013-06-03 (×2): qty 1

## 2013-06-03 MED ORDER — LATANOPROST 0.005 % OP SOLN
1.0000 [drp] | Freq: Every day | OPHTHALMIC | Status: DC
  Administered 2013-06-03 – 2013-06-09 (×7): 1 [drp] via OPHTHALMIC
  Filled 2013-06-03: qty 2.5

## 2013-06-03 MED ORDER — MONTELUKAST SODIUM 10 MG PO TABS
10.0000 mg | ORAL_TABLET | Freq: Every day | ORAL | Status: DC
  Administered 2013-06-03 – 2013-06-05 (×3): 10 mg via ORAL
  Filled 2013-06-03 (×4): qty 1

## 2013-06-03 MED ORDER — LOSARTAN POTASSIUM 50 MG PO TABS
50.0000 mg | ORAL_TABLET | Freq: Every day | ORAL | Status: DC
  Administered 2013-06-03 – 2013-06-04 (×2): 50 mg via ORAL
  Filled 2013-06-03 (×3): qty 1

## 2013-06-03 MED ORDER — IPRATROPIUM-ALBUTEROL 0.5-2.5 (3) MG/3ML IN SOLN
3.0000 mL | RESPIRATORY_TRACT | Status: DC
  Administered 2013-06-03 (×4): 3 mL via RESPIRATORY_TRACT
  Filled 2013-06-03 (×5): qty 3

## 2013-06-03 NOTE — ED Notes (Signed)
Bed: RESA Expected date: 06/03/13 Expected time: 1:22 AM Means of arrival: Ambulance Comments: Sob, tachypnea

## 2013-06-03 NOTE — ED Notes (Addendum)
Per EMS: pt from [facility], pt had immediate SOB during the night. Pt has nonproductive cough. 88% on nasal cannula at home. Given 10 albuterol and 0.5 atroivent 100%.

## 2013-06-03 NOTE — Progress Notes (Signed)
[name] PROGRESS NOTE  Tonya [name] ZOX:[id number] DOB: 1922-08-07 DOA: 06/03/2013 PCP: Tonya CorwinMCKEOWN,WILLIAM DAVID, MD  Assessment/Plan  Acute on chronic hypoxic respiratory failure likely secondary to acute COPD exacerbation.  She may have some diastolic heart failure. Still has increased work of breathing. -  Continue Solu-Medrol -  Continue DuoNeb -  Continue Symbicort -  Continue antibiotic -  Check proBNP:  331 -  Consider IV Lasix  MAT and premature atrial beats, likely secondary to respirator distress and bronchodilators -  Telemetry dementia the heart rate is trending down  Hypertension, blood pressure currently within normal limits -  Continue blood pressure medications  Normocytic anemia, hemoglobin at baseline. Defer to primary care doctor. Thrombocytopenia, mild without evidence of bleeding. Likely acute phase reactant. Trend  Diet:  Regular Access:  PIV IVF:  Off Proph:  Lovenox  Code Status: DO NOT RESUSCITATE Family Communication: Patient alone Disposition Plan: Pending improvement in respiratory distress   Consultants:  None  Procedures:  Chest x-ray  Antibiotics:  Levofloxacin from 1/16   HPI/Subjective:  Patient states that she still feels very Tonya [name] of breath, she is coughing frequently. She feels very tired and fatigued.  Objective: Filed Vitals:   06/03/13 0810 06/03/13 1234 06/03/13 1433 06/03/13 1543  BP:   119/52   Pulse:   89   Temp:   98.1 F (36.7 C)   TempSrc:   Oral   Resp:   21   Height:      Weight:      SpO2: 95% 96% 99% 98%    Intake/Output Summary (Last 24 hours) at 06/03/13 1711 Last data filed at 06/03/13 1300  Gross per 24 hour  Intake    390 ml  Output      0 ml  Net    390 ml   Filed Weights   06/03/13 0417  Weight: 65.998 kg (145 lb 8 oz)    Exam:   General:  [name], N mild to moderate respiratory distress with SCM and supraclavicular retractions  HEENT:  NCAT,  MMM  Cardiovascular:  Mild tachycardia with regular rate and rhythm, nl S1, S2 no mrg, 2+ pulses, warm extremities  Respiratory:  Rales at the bilateral bases and heard on the anterior left chest. Full x-ray wheeze and very diminished at the bases, positive rhonchi, + increased WOB  Abdomen:   NABS, soft, NT/ND  MSK:   Normal tone and bulk, no LEE  Neuro:  Grossly intact  Data Reviewed: Basic Metabolic Panel:  Recent Labs Lab 06/03/13 0147 06/03/13 0528  NA 136* 137  K 4.5 3.8  CL 95* 96  CO2 28 29  GLUCOSE 151* 189*  BUN 13 13  CREATININE 0.69 0.75  CALCIUM 9.2 8.9   Liver Function Tests:  Recent Labs Lab 06/03/13 0147 06/03/13 0528  AST 34 21  ALT 23 20  ALKPHOS 62 58  BILITOT 0.4 0.4  PROT 6.9 6.2  ALBUMIN 3.7 3.2*   No results found for this basename: LIPASE, AMYLASE,  in the last 168 hours No results found for this basename: AMMONIA,  in the last 168 hours CBC:  Recent Labs Lab 06/03/13 0147 06/03/13 0528  WBC 11.3* 10.2  NEUTROABS 9.5*  --   HGB 11.7* 10.2*  HCT 34.4* 30.6*  MCV 93.7 94.7  PLT 148* 121*   Cardiac Enzymes:  Recent Labs Lab 06/03/13 0147  TROPONINI <0.30   BNP (last 3 results)  Recent Labs  06/03/13 0147  PROBNP 331.7  CBG: No results found for this basename: GLUCAP,  in the last 168 hours  Recent Results (from the past 240 hour(s))  MRSA PCR SCREENING     Status: None   Collection Time    06/03/13  5:28 AM      Result Value Range Status   MRSA by PCR NEGATIVE  NEGATIVE Final   Comment:            The GeneXpert MRSA Assay (FDA     approved for NASAL specimens     only), is one component of a     comprehensive MRSA colonization     surveillance program. It is not     intended to diagnose MRSA     infection nor to guide or     monitor treatment for     MRSA infections.     Studies: Dg Chest Port 1 View  06/03/2013   CLINICAL DATA:  Shortness of breath  EXAM: PORTABLE CHEST - 1 VIEW  COMPARISON:   05/04/2012  FINDINGS: Hyperinflation and chronic interstitial coarsening. No infiltrate or edema. No effusion or pneumothorax. Normal heart size.  IMPRESSION: COPD without evidence of acute superimposed disease.   Electronically Signed   By: Tiburcio Pea M.D.   On: 06/03/2013 02:40    Scheduled Meds: . aspirin EC  81 mg Oral Daily  . budesonide-formoterol  2 puff Inhalation BID  . enoxaparin (LOVENOX) injection  40 mg Subcutaneous Q24H  . furosemide  40 mg Oral Daily  . guaiFENesin  600 mg Oral BID  . ipratropium-albuterol  3 mL Nebulization Q4H  . latanoprost  1 drop Right Eye QHS  . [START ON 06/05/2013] levofloxacin (LEVAQUIN) IV  750 mg Intravenous Q48H  . losartan  50 mg Oral Daily  . methylPREDNISolone (SOLU-MEDROL) injection  60 mg Intravenous Q6H  . montelukast  10 mg Oral QHS  . simvastatin  40 mg Oral q1800  . sodium chloride  3 mL Intravenous Q12H   Continuous Infusions:   Principal Problem:   COPD exacerbation Active Problems:   HYPERTENSION   Atrial fibrillation    Time spent: 30 min    Miski Feldpausch, [facility]  [name] Pager [phone]. If 7PM-7AM, please contact night-coverage at [email or web address], password [facility] 06/03/2013, 5:11 PM  LOS: 0 days

## 2013-06-03 NOTE — Care Management Note (Addendum)
    Page 1 of 1   06/03/2013     1:07:22 PM   CARE MANAGEMENT NOTE 06/03/2013  Patient:  Tonya [name] Tonya [name] [name],Tonya [name] Tonya [name] [name]   Account Number:  [id number]  Date Initiated:  06/03/2013  Documentation initiated by:  Lanier ClamMAHABIR,Slayden Mennenga  Subjective/Objective Assessment:   80 Y/O F ADMITTED Tonya [name] [name]/SOB.COPD EXAC.     Action/Plan:   FROM [place].HAS DTR-[facility].HAS HOME 02-ADULT PEDIATRIC SERVICES-HAS TRAVEL TANK,4WHEELED RW,3N1,SHOWER CHAIR.   Anticipated DC Date:  06/06/2013   Anticipated DC Plan:  HOME/SELF CARE      DC Planning Services  CM consult      Choice offered to / List presented to:             Status of service:  In process, will continue to follow Medicare Important Message given?   (If response is "NO", the following Medicare IM given date fields will be blank) Date Medicare IM given:   Date Additional Medicare IM given:    Discharge Disposition:    Per UR Regulation:  Reviewed for med. necessity/level of care/duration of stay  If discussed at Long Length of Stay Meetings, dates discussed:    Comments:  06/03/13 Camdon Saetern RN,BSN NCM [phone] WOULD RECOMMEND PT/OT CONS WHEN APPROPRIATE.

## 2013-06-03 NOTE — H&P (Signed)
[name] History and Physical  Patient: Tonya [name]  ZOX:[id number]  DOB: 1922-12-22  DOS: the patient was seen and examined on 06/03/2013 PCP: Nadean Corwin, MD  Chief Complaint: shortness of breath with cough  HPI: Tonya [name] is a 80 y.o. female with Past medical history of COPD, hypertension. The patient is coming from ALF. The patient is presenting with complaints of cough and shortness of breath that started earlier this morning. She mentions that she had some hacking cough yesterday and today and as the day progressed she started having some shortness of breath with the excessive sputum production which is greenish yellow in color without any blood. Also having some fever and chills and in the afternoon as well as in the evening the breathing got worse and therefore she called 911.  Review of Systems: as mentioned in the history of present illness.  A Comprehensive review of the other systems is negative.  Past Medical History  Diagnosis Date  . Emphysema   . Hypertension   . Pneumonia    Past Surgical History  Procedure Laterality Date  . Abdominal hysterectomy    . Hernia repair      inguinal  . Elbow surgery     Social History:  reports that she has quit smoking. Her smoking use included Cigarettes. She has a 30 pack-year smoking history. She does not have any smokeless tobacco history on file. She reports that she does not drink alcohol or use illicit drugs. Independent for most of her  ADL.  Allergies  Allergen Reactions  . Ciprofloxacin     REACTION: nausea  . Erythromycin     REACTION: rash  . Penicillins     REACTION: nausea  . Verapamil     REACTION: nausea    Family History  Problem Relation Age of Onset  . Stroke Mother   . Kidney failure Father     Prior to Admission medications   Medication Sig Start Date End Date Taking? Authorizing Provider  albuterol (PROVENTIL) (2.5 MG/3ML) 0.083% nebulizer solution Take 2.5  mg by nebulization every 6 (six) hours as needed for wheezing or shortness of breath.   Yes Historical Provider, MD  ALPRAZolam Prudy Feeler) 0.5 MG tablet Take 0.5 mg by mouth 3 (three) times daily. For anxiety   Yes Historical Provider, MD  aspirin EC 81 MG tablet Take 81 mg by mouth daily.   Yes Historical Provider, MD  beta carotene w/minerals (OCUVITE) tablet Take 1 tablet by mouth daily.   Yes Historical Provider, MD  budesonide-formoterol (SYMBICORT) 160-4.5 MCG/ACT inhaler Inhale 2 puffs into the lungs 2 (two) times daily.   Yes Historical Provider, MD  cholecalciferol (VITAMIN D) 1000 UNITS tablet Take 1,000 Units by mouth daily.   Yes Historical Provider, MD  ferrous sulfate 325 (65 FE) MG tablet Take 325 mg by mouth daily with breakfast.   Yes Historical Provider, MD  furosemide (LASIX) 40 MG tablet Take 40 mg by mouth 2 (two) times daily.   Yes Historical Provider, MD  latanoprost (XALATAN) 0.005 % ophthalmic solution Place 1 drop into the right eye at bedtime.    Yes Historical Provider, MD  levalbuterol (XOPENEX) 0.63 MG/3ML nebulizer solution Take 3 mLs (0.63 mg total) by nebulization 3 (three) times daily. 05/07/12  Yes Sorin Luanne Bras, MD  losartan (COZAAR) 50 MG tablet Take 50 mg by mouth daily.   Yes Historical Provider, MD  pravastatin (PRAVACHOL) 40 MG tablet Take 40 mg by mouth at bedtime.  Yes Historical Provider, MD  tiotropium (SPIRIVA) 18 MCG inhalation capsule Place 1 capsule (18 mcg total) into inhaler and inhale daily. 05/07/12  Yes Sorin Luanne Bras, MD  zafirlukast (ACCOLATE) 20 MG tablet Take 20 mg by mouth 2 (two) times daily.   Yes Historical Provider, MD    Physical Exam: Filed Vitals:   06/03/13 0130 06/03/13 0245 06/03/13 0300  BP: 178/137    Pulse: 121    Temp: 99.1 F (37.3 C)    TempSrc: Oral    Resp: 32    SpO2: 100% 98% 99%    General: Alert, Awake and Oriented to Time, Place and Person. Appear in marked distress Eyes: PERRL ENT: Oral Mucosa clear  moist. Neck: no JVD Cardiovascular: S1 and S2 Present, no Murmur, Peripheral Pulses Present Respiratory: Bilateral Air entry equal and Decreased, no Crackles,extensive expiratory wheezes Abdomen: Bowel Sound Present, Soft and Non tender Skin: no Rash Extremities: no Pedal edema, no calf tenderness Neurologic: Grossly Unremarkable.  Labs on Admission:  CBC:  Recent Labs Lab 06/03/13 0147  WBC 11.3*  NEUTROABS 9.5*  HGB 11.7*  HCT 34.4*  MCV 93.7  PLT 148*    CMP     Component Value Date/Time   NA 136* 06/03/2013 0147   K 4.5 06/03/2013 0147   CL 95* 06/03/2013 0147   CO2 28 06/03/2013 0147   GLUCOSE 151* 06/03/2013 0147   BUN 13 06/03/2013 0147   CREATININE 0.69 06/03/2013 0147   CALCIUM 9.2 06/03/2013 0147   PROT 6.9 06/03/2013 0147   ALBUMIN 3.7 06/03/2013 0147   AST 34 06/03/2013 0147   ALT 23 06/03/2013 0147   ALKPHOS 62 06/03/2013 0147   BILITOT 0.4 06/03/2013 0147   GFRNONAA 75* 06/03/2013 0147   GFRAA 87* 06/03/2013 0147    No results found for this basename: LIPASE, AMYLASE,  in the last 168 hours No results found for this basename: AMMONIA,  in the last 168 hours   Recent Labs Lab 06/03/13 0147  TROPONINI <0.30   BNP (last 3 results)  Recent Labs  06/03/13 0147  PROBNP 331.7    Radiological Exams on Admission: Dg Chest Port 1 View  06/03/2013   CLINICAL DATA:  Shortness of breath  EXAM: PORTABLE CHEST - 1 VIEW  COMPARISON:  05/04/2012  FINDINGS: Hyperinflation and chronic interstitial coarsening. No infiltrate or edema. No effusion or pneumothorax. Normal heart size.  IMPRESSION: COPD without evidence of acute superimposed disease.   Electronically Signed   By: Tiburcio Pea M.D.   On: 06/03/2013 02:40    EKG: Independently reviewed. atrial fibrillation, rate controlled.  Assessment/Plan Principal Problem:   COPD exacerbation Active Problems:   HYPERTENSION   Atrial fibrillation   1. COPD exacerbation The patient is presenting with complaints of  cough and shortness of breath a chest x-ray does not show pneumonia but she does appear to have mild leukocytosis. She has mild had begun here as well. She is chronically on oxygen. She appears in significant respiratory distress and tachypnea. I would the step down unit. I will give her Darvocet-N 100 60 mg every 6 hours, DuoNeb every 4 hours, IV levofloxacin and oxygen as needed. I would also give her BiPAP as needed. The patient mentioned that she wants to be DNR/DNI.  2. Hypertension Continuing her home antihypertensive medications with when necessary IV Lopressor for both tachycardia and hypertension  3. Atrial fibrillation The patient did not have any prior evidence of atrial fibrillation at present her rate is controlled  in 110s. I will monitor her on telemetry and use IV Lopressor as needed for rate control   DVT Prophylaxis: subcutaneous Heparin Nutrition: cardiac diet  Code Status: DNR DNI per my discussion with the patient  Disposition: Admitted to inpatient in step-down unit.  Author: Lynden OxfordPranav Joban Colledge, MD [facility] Pager: [phone] 06/03/2013, 3:27 AM    If 7PM-7AM, please contact night-coverage [email or web address] Password [email or web address]

## 2013-06-03 NOTE — ED Provider Notes (Signed)
CSN: [id number]     Arrival date & time 06/03/13  0126 History   First MD Initiated Contact with Patient 06/03/13 0132     Chief Complaint  Patient presents with  . Shortness of Breath   (Consider location/radiation/quality/duration/timing/severity/associated sxs/prior Treatment) HPI Patient presents via EMS for shortness of breath and cough. She has a history of COPD and is on chronic oxygen. She's been using her nebulizer multiple times throughout the day and has had progressive shortness of breath. She's had productive cough of yellow sputum. No fevers or chills. She denies any chest pain. She has bilateral lower sugary swelling that is unchanged from her baseline. She's received a continuous albuterol nebulizer en route with mild improvement of her shortness of breath. Past Medical History  Diagnosis Date  . Emphysema   . Hypertension   . Pneumonia    Past Surgical History  Procedure Laterality Date  . Abdominal hysterectomy    . Hernia repair      inguinal  . Elbow surgery     Family History  Problem Relation Age of Onset  . Stroke Mother   . Kidney failure Father    History  Substance Use Topics  . Smoking status: Former Smoker -- 0.50 packs/day for 60 years    Types: Cigarettes  . Smokeless tobacco: Not on file  . Alcohol Use: No   OB History   Grav Para Term Preterm Abortions TAB SAB Ect Mult Living                 Review of Systems  Constitutional: Negative for fever and chills.  Respiratory: Positive for cough, shortness of breath and wheezing.   Cardiovascular: Positive for leg swelling. Negative for chest pain and palpitations.  Gastrointestinal: Negative for nausea, vomiting, abdominal pain and diarrhea.  Musculoskeletal: Negative for back pain.  Skin: Negative for rash and wound.  Neurological: Negative for weakness, numbness and headaches.  All other systems reviewed and are negative.    Allergies  Ciprofloxacin; Erythromycin; Penicillins; and  Verapamil  Home Medications   Current Outpatient Rx  Name  Route  Sig  Dispense  Refill  . ALPRAZolam (XANAX) 0.5 MG tablet   Oral   Take 0.25-0.5 mg by mouth 3 (three) times daily as needed. For anxiety         . beta carotene w/minerals (OCUVITE) tablet   Oral   Take 1 tablet by mouth daily.         . budesonide-formoterol (SYMBICORT) 160-4.5 MCG/ACT inhaler   Inhalation   Inhale 2 puffs into the lungs 2 (two) times daily.         . furosemide (LASIX) 40 MG tablet   Oral   Take 1 tablet (40 mg total) by mouth daily.   30 tablet      . latanoprost (XALATAN) 0.005 % ophthalmic solution   Right Eye   Place 1 drop into the right eye at bedtime.          . levalbuterol (XOPENEX) 0.63 MG/3ML nebulizer solution   Nebulization   Take 3 mLs (0.63 mg total) by nebulization 3 (three) times daily.   300 mL   0   . losartan (COZAAR) 50 MG tablet   Oral   Take 50 mg by mouth daily.         . predniSONE (STERAPRED UNI-PAK) 10 MG tablet   Oral   Take 1 tablet (10 mg total) by mouth See admin instructions.   21 tablet  0   . tiotropium (SPIRIVA) 18 MCG inhalation capsule   Inhalation   Place 1 capsule (18 mcg total) into inhaler and inhale daily.   30 capsule   0   . zafirlukast (ACCOLATE) 20 MG tablet   Oral   Take 20 mg by mouth 2 (two) times daily.          BP 178/137  Pulse 121  Temp(Src) 99.1 F (37.3 C) (Oral)  Resp 32  SpO2 100% Physical Exam  Nursing note and vitals reviewed. Constitutional: She is oriented to person, place, and time. She appears well-developed and well-nourished. She appears distressed.  HENT:  Head: Normocephalic and atraumatic.  Mouth/Throat: Oropharynx is clear and moist. No oropharyngeal exudate.  Eyes: EOM are normal. Pupils are equal, round, and reactive to light.  Neck: Normal range of motion. Neck supple.  Cardiovascular: Normal rate and regular rhythm.   Pulmonary/Chest: Effort normal and breath sounds normal. No  respiratory distress. She has no wheezes. She has no rales.  Speaking in few word sentences. Patient with increased work of breathing. Diffuse expiratory and expiratory wheezes throughout.  Abdominal: Soft. Bowel sounds are normal. She exhibits no distension and no mass. There is no tenderness. There is no rebound and no guarding.  Musculoskeletal: Normal range of motion. She exhibits no edema and no tenderness.  Bilateral 2+ pitting edema of lower extremities  Neurological: She is alert and oriented to person, place, and time.  Moves all extremities. Sensation grossly intact.  Skin: Skin is warm and dry. No rash noted. No erythema.  Psychiatric: She has a normal mood and affect. Her behavior is normal.    ED Course  Procedures (including critical care time) Labs Review Labs Reviewed  CBC WITH DIFFERENTIAL  COMPREHENSIVE METABOLIC PANEL  TROPONIN I  PRO B NATRIURETIC PEPTIDE  BLOOD GAS, ARTERIAL   Imaging Review No results found.  EKG Interpretation   None      Date: 06/03/2013  Rate: 122  Rhythm: sinus tachycardia  QRS Axis: normal  Intervals: normal  ST/T Wave abnormalities: nonspecific T wave changes  Conduction Disutrbances:none  Narrative Interpretation:   Old EKG Reviewed: none available    MDM  We'll closely monitor. Patient may need to be on BiPAP temporarily.  Patient is breathing much more comfortably after the nebulized treatment. No definite pneumonia on x-ray. I discussed with [facility] will admit to telemetry bed. Asked to have IV antibiotic started.  Loren Racer, MD 06/03/13 [phone]

## 2013-06-03 NOTE — Progress Notes (Signed)
ANTIBIOTIC CONSULT NOTE - INITIAL  Pharmacy Consult for Levaquin Indication: COPD exacerbation  Allergies  Allergen Reactions  . Ciprofloxacin     REACTION: nausea  . Erythromycin     REACTION: rash  . Penicillins     REACTION: nausea  . Verapamil     REACTION: nausea    Patient Measurements: Height: 5\' 5"  (165.1 cm) Weight: 145 lb 8 oz (65.998 kg) IBW/kg (Calculated) : 57   Vital Signs: Temp: 98.4 F (36.9 C) (01/16 0417) Temp src: Oral (01/16 0417) BP: 138/60 mmHg (01/16 0417) Pulse Rate: 106 (01/16 0417) Intake/Output from previous day:   Intake/Output from this shift:    Labs:  Recent Labs  06/03/13 0147  WBC 11.3*  HGB 11.7*  PLT 148*  CREATININE 0.69   Estimated Creatinine Clearance: 43.7 ml/min (by C-G formula based on Cr of 0.69). No results found for this basename: VANCOTROUGH, VANCOPEAK, VANCORANDOM, GENTTROUGH, GENTPEAK, GENTRANDOM, TOBRATROUGH, TOBRAPEAK, TOBRARND, AMIKACINPEAK, AMIKACINTROU, AMIKACIN,  in the last 72 hours   Microbiology: No results found for this or any previous visit (from the past 720 hour(s)).  Medical History: Past Medical History  Diagnosis Date  . Emphysema   . Hypertension   . Pneumonia     Medications:  Scheduled:  . albuterol  2.5 mg Nebulization Q4H  . aspirin EC  81 mg Oral Daily  . budesonide-formoterol  2 puff Inhalation BID  . enoxaparin (LOVENOX) injection  40 mg Subcutaneous Q24H  . guaiFENesin  600 mg Oral BID  . ipratropium  0.5 mg Nebulization Q4H  . latanoprost  1 drop Right Eye QHS  . [START ON 06/05/2013] levofloxacin (LEVAQUIN) IV  750 mg Intravenous Q48H  . losartan  50 mg Oral Daily  . methylPREDNISolone (SOLU-MEDROL) injection  60 mg Intravenous Q6H  . montelukast  10 mg Oral QHS  . oseltamivir  75 mg Oral BID  . simvastatin  40 mg Oral q1800  . sodium chloride  3 mL Intravenous Q12H   Infusions:  . albuterol 10 mg/hr (06/03/13 0255)   Assessment: [age] yo with hx COPD, HTN c/o SOB,  cough.  Levaquin per Rx for COPD exacerbation.  Goal of Therapy:  Treat infection  Plan:   Levaquin 750mg  IV q48h  F/U SCr/cultures as needed  Susanne GreenhouseGreen, Moreen Piggott R 06/03/2013,5:38 AM

## 2013-06-03 NOTE — ED Notes (Signed)
EKG given to EDP,[place],MD. For review. 

## 2013-06-04 DIAGNOSIS — D696 Thrombocytopenia, unspecified: Secondary | ICD-10-CM

## 2013-06-04 DIAGNOSIS — J9600 Acute respiratory failure, unspecified whether with hypoxia or hypercapnia: Secondary | ICD-10-CM

## 2013-06-04 DIAGNOSIS — D649 Anemia, unspecified: Secondary | ICD-10-CM

## 2013-06-04 LAB — BASIC METABOLIC PANEL
BUN: 26 mg/dL — ABNORMAL HIGH (ref 6–23)
CALCIUM: 8.7 mg/dL (ref 8.4–10.5)
CO2: 30 meq/L (ref 19–32)
CREATININE: 0.85 mg/dL (ref 0.50–1.10)
Chloride: 94 mEq/L — ABNORMAL LOW (ref 96–112)
GFR calc Af Amer: 69 mL/min — ABNORMAL LOW (ref >90)
GFR, EST NON AFRICAN AMERICAN: 59 mL/min — AB (ref >90)
GLUCOSE: 129 mg/dL — AB (ref 70–99)
Potassium: 4.8 mEq/L (ref 3.7–5.3)
Sodium: 133 mEq/L — ABNORMAL LOW (ref 137–147)

## 2013-06-04 LAB — CBC
HCT: 29.2 % — ABNORMAL LOW (ref 36.0–46.0)
Hemoglobin: 9.7 g/dL — ABNORMAL LOW (ref 12.0–15.0)
MCH: 31.2 pg (ref 26.0–34.0)
MCHC: 33.2 g/dL (ref 30.0–36.0)
MCV: 93.9 fL (ref 78.0–100.0)
PLATELETS: 121 10*3/uL — AB (ref 150–400)
RBC: 3.11 MIL/uL — ABNORMAL LOW (ref 3.87–5.11)
RDW: 13.1 % (ref 11.5–15.5)
WBC: 14.8 10*3/uL — ABNORMAL HIGH (ref 4.0–10.5)

## 2013-06-04 MED ORDER — METHYLPREDNISOLONE SODIUM SUCC 125 MG IJ SOLR
60.0000 mg | Freq: Two times a day (BID) | INTRAMUSCULAR | Status: DC
  Administered 2013-06-04 – 2013-06-05 (×3): 60 mg via INTRAVENOUS
  Filled 2013-06-04 (×4): qty 0.96

## 2013-06-04 MED ORDER — TIOTROPIUM BROMIDE MONOHYDRATE 18 MCG IN CAPS
18.0000 ug | ORAL_CAPSULE | Freq: Every day | RESPIRATORY_TRACT | Status: DC
  Filled 2013-06-04: qty 5

## 2013-06-04 MED ORDER — WHITE PETROLATUM GEL
Freq: Two times a day (BID) | Status: DC
  Administered 2013-06-04 – 2013-06-05 (×3): via TOPICAL
  Administered 2013-06-06 – 2013-06-10 (×8): 1 via TOPICAL
  Filled 2013-06-04 (×14): qty 5

## 2013-06-04 MED ORDER — LEVALBUTEROL HCL 0.63 MG/3ML IN NEBU
0.6300 mg | INHALATION_SOLUTION | Freq: Four times a day (QID) | RESPIRATORY_TRACT | Status: DC
  Administered 2013-06-04 – 2013-06-06 (×10): 0.63 mg via RESPIRATORY_TRACT
  Filled 2013-06-04 (×17): qty 3

## 2013-06-04 MED ORDER — DOCUSATE SODIUM 100 MG PO CAPS
100.0000 mg | ORAL_CAPSULE | Freq: Two times a day (BID) | ORAL | Status: DC
  Administered 2013-06-04 – 2013-06-07 (×7): 100 mg via ORAL
  Filled 2013-06-04 (×8): qty 1

## 2013-06-04 MED ORDER — LEVALBUTEROL HCL 0.63 MG/3ML IN NEBU: 0.6300 mg | INHALATION_SOLUTION | Freq: Three times a day (TID) | RESPIRATORY_TRACT | Status: DC | PRN

## 2013-06-04 MED ORDER — IPRATROPIUM BROMIDE 0.02 % IN SOLN
0.5000 mg | Freq: Four times a day (QID) | RESPIRATORY_TRACT | Status: DC
  Administered 2013-06-04 – 2013-06-06 (×9): 0.5 mg via RESPIRATORY_TRACT
  Filled 2013-06-04 (×9): qty 2.5

## 2013-06-04 MED ORDER — SENNA 8.6 MG PO TABS
2.0000 | ORAL_TABLET | Freq: Every day | ORAL | Status: DC
  Administered 2013-06-04 – 2013-06-09 (×6): 17.2 mg via ORAL
  Filled 2013-06-04 (×6): qty 2

## 2013-06-04 MED ORDER — MAGNESIUM HYDROXIDE 400 MG/5ML PO SUSP
30.0000 mL | Freq: Every day | ORAL | Status: DC | PRN
  Administered 2013-06-04: 30 mL via ORAL
  Filled 2013-06-04: qty 30

## 2013-06-04 MED ORDER — GLYCERIN (LAXATIVE) 2.1 G RE SUPP
1.0000 | Freq: Every day | RECTAL | Status: DC | PRN
  Filled 2013-06-04: qty 1

## 2013-06-04 MED ORDER — FUROSEMIDE 10 MG/ML IJ SOLN
40.0000 mg | Freq: Every day | INTRAMUSCULAR | Status: DC
  Administered 2013-06-04 – 2013-06-05 (×2): 40 mg via INTRAVENOUS
  Filled 2013-06-04 (×2): qty 4

## 2013-06-04 MED ORDER — SALINE SPRAY 0.65 % NA SOLN
1.0000 | NASAL | Status: DC | PRN
  Filled 2013-06-04: qty 44

## 2013-06-04 MED ORDER — ALPRAZOLAM 0.5 MG PO TABS
0.5000 mg | ORAL_TABLET | Freq: Three times a day (TID) | ORAL | Status: DC
  Administered 2013-06-04 – 2013-06-05 (×4): 0.5 mg via ORAL
  Filled 2013-06-04 (×4): qty 1

## 2013-06-04 NOTE — Progress Notes (Addendum)
[name] PROGRESS NOTE  Tonya NestleKathleen W [name] ZOX:[id number] DOB: Dec 16, 1922 DOA: 06/03/2013 PCP: Tonya [name]  Assessment/Plan  Acute on chronic hypoxic respiratory failure likely secondary to acute COPD exacerbation.  She may have some diastolic heart failure. Still has increased work of breathing. -  Decrease to BID Solu-Medrol -  Continue DuoNeb -  Continue Symbicort -  Continue antibiotic -  Check proBNP:  331  Possible acute diastolic heart failure -  One dose IV Lasix today, then no more as BUN and creatinine are starting to rise  MAT and premature atrial beats, likely secondary to respirator distress and bronchodilators and now NSR on telemetry -  okay to d/c telemetry  Hypertension, blood pressure currently within normal limits -  Continue blood pressure medications  Normocytic anemia, hemoglobin trending down slightly. Defer to primary care doctor. Thrombocytopenia, mild without evidence of bleeding. Likely acute phase reactant and stable today Leukocytosis, likely secondary to steroids.    Diet:  Regular Access:  PIV IVF:  Off Proph:  Lovenox  Code Status: DO NOT RESUSCITATE Family Communication: Patient alone Disposition Plan: Pending improvement in respiratory distress, PT/OT   Consultants:  None  Procedures:  Chest x-ray  Antibiotics:  Levofloxacin from 1/16   HPI/Subjective:  Patient states that she still feels very Anjelique Makar of breath, she is coughing frequently. She feels very tired and fatigued.  Objective: Filed Vitals:   06/03/13 2013 06/03/13 2101 06/04/13 0629 06/04/13 0850  BP:  139/59 121/53   Pulse:  95 83   Temp:  98 F (36.7 C) 98 F (36.7 C)   TempSrc:  Oral Oral   Resp:  22 20   Height:      Weight:   67.903 kg (149 lb 11.2 oz)   SpO2: 98% 96% 97% 96%    Intake/Output Summary (Last 24 hours) at 06/04/13 1207 Last data filed at 06/04/13 1135  Gross per 24 hour  Intake    480 ml  Output   3100 ml  Net   -2620 ml   Filed Weights   06/03/13 0417 06/04/13 0629  Weight: 65.998 kg (145 lb 8 oz) 67.903 kg (149 lb 11.2 oz)    Exam:   General:  [name], mild respiratory distress with less SCM and supraclavicular retractions  HEENT:  NCAT, MMM  Cardiovascular:  RRR, nl S1, S2 no mrg, 2+ pulses, warm extremities  Respiratory:  Rales at the bilateral bases. Full expiratory wheeze and diminished at the bases, positive rhonchi, less WOB today  Abdomen:   NABS, soft, NT/ND  MSK:   Normal tone and bulk, no LEE  Neuro:  Grossly intact  Data Reviewed: Basic Metabolic Panel:  Recent Labs Lab 06/03/13 0147 06/03/13 0528 06/04/13 0615  NA 136* 137 133*  K 4.5 3.8 4.8  CL 95* 96 94*  CO2 28 29 30   GLUCOSE 151* 189* 129*  BUN 13 13 26*  CREATININE 0.69 0.75 0.85  CALCIUM 9.2 8.9 8.7   Liver Function Tests:  Recent Labs Lab 06/03/13 0147 06/03/13 0528  AST 34 21  ALT 23 20  ALKPHOS 62 58  BILITOT 0.4 0.4  PROT 6.9 6.2  ALBUMIN 3.7 3.2*   No results found for this basename: LIPASE, AMYLASE,  in the last 168 hours No results found for this basename: AMMONIA,  in the last 168 hours CBC:  Recent Labs Lab 06/03/13 0147 06/03/13 0528 06/04/13 0615  WBC 11.3* 10.2 14.8*  NEUTROABS 9.5*  --   --  HGB 11.7* 10.2* 9.7*  HCT 34.4* 30.6* 29.2*  MCV 93.7 94.7 93.9  PLT 148* 121* 121*   Cardiac Enzymes:  Recent Labs Lab 06/03/13 0147  TROPONINI <0.30   BNP (last 3 results)  Recent Labs  06/03/13 0147  PROBNP 331.7   CBG: No results found for this basename: GLUCAP,  in the last 168 hours  Recent Results (from the past 240 hour(s))  MRSA PCR SCREENING     Status: None   Collection Time    06/03/13  5:28 AM      Result Value Range Status   MRSA by PCR NEGATIVE  NEGATIVE Final   Comment:            The GeneXpert MRSA Assay (FDA     approved for NASAL specimens     only), is one component of a     comprehensive MRSA colonization     surveillance  program. It is not     intended to diagnose MRSA     infection nor to guide or     monitor treatment for     MRSA infections.     Studies: Dg Chest Port 1 View  06/03/2013   CLINICAL DATA:  Shortness of breath  EXAM: PORTABLE CHEST - 1 VIEW  COMPARISON:  05/04/2012  FINDINGS: Hyperinflation and chronic interstitial coarsening. No infiltrate or edema. No effusion or pneumothorax. Normal heart size.  IMPRESSION: COPD without evidence of acute superimposed disease.   Electronically Signed   By: Tiburcio Pea M.D.   On: 06/03/2013 02:40    Scheduled Meds: . ALPRAZolam  0.5 mg Oral TID  . aspirin EC  81 mg Oral Daily  . budesonide-formoterol  2 puff Inhalation BID  . docusate sodium  100 mg Oral BID  . enoxaparin (LOVENOX) injection  40 mg Subcutaneous Q24H  . furosemide  40 mg Intravenous Daily  . guaiFENesin  600 mg Oral BID  . ipratropium  0.5 mg Nebulization Q6H  . latanoprost  1 drop Right Eye QHS  . levalbuterol  0.63 mg Nebulization Q6H WA  . [START ON 06/05/2013] levofloxacin (LEVAQUIN) IV  750 mg Intravenous Q48H  . losartan  50 mg Oral Daily  . methylPREDNISolone (SOLU-MEDROL) injection  60 mg Intravenous Q12H  . montelukast  10 mg Oral QHS  . senna  2 tablet Oral QHS  . simvastatin  40 mg Oral q1800  . sodium chloride  3 mL Intravenous Q12H  . white petrolatum   Topical BID   Continuous Infusions:   Principal Problem:   COPD exacerbation Active Problems:   HYPERTENSION   Atrial fibrillation   Normocytic anemia   Thrombocytopenia, unspecified    Time spent: 30 min    Kordel Leavy, [facility]  [name] Pager [phone]. If 7PM-7AM, please contact night-coverage at [email or web address], password [facility] 06/04/2013, 12:07 PM  LOS: 1 day

## 2013-06-05 DIAGNOSIS — E871 Hypo-osmolality and hyponatremia: Secondary | ICD-10-CM

## 2013-06-05 LAB — BASIC METABOLIC PANEL
BUN: 26 mg/dL — ABNORMAL HIGH (ref 6–23)
CO2: 31 mEq/L (ref 19–32)
CREATININE: 0.79 mg/dL (ref 0.50–1.10)
Calcium: 8.5 mg/dL (ref 8.4–10.5)
Chloride: 90 mEq/L — ABNORMAL LOW (ref 96–112)
GFR calc Af Amer: 84 mL/min — ABNORMAL LOW (ref >90)
GFR, EST NON AFRICAN AMERICAN: 72 mL/min — AB (ref >90)
Glucose, Bld: 154 mg/dL — ABNORMAL HIGH (ref 70–99)
Potassium: 5.2 mEq/L (ref 3.7–5.3)
SODIUM: 130 meq/L — AB (ref 137–147)

## 2013-06-05 MED ORDER — BENZONATATE 100 MG PO CAPS
100.0000 mg | ORAL_CAPSULE | Freq: Three times a day (TID) | ORAL | Status: DC
  Administered 2013-06-05 – 2013-06-06 (×2): 100 mg via ORAL
  Filled 2013-06-05 (×6): qty 1

## 2013-06-05 MED ORDER — SODIUM CHLORIDE 0.9 % IV SOLN: INTRAVENOUS | Status: AC

## 2013-06-05 MED ORDER — GUAIFENESIN-CODEINE 100-10 MG/5ML PO SOLN
5.0000 mL | ORAL | Status: DC | PRN
  Administered 2013-06-05 – 2013-06-06 (×3): 5 mL via ORAL
  Filled 2013-06-05 (×3): qty 5

## 2013-06-05 MED ORDER — ENSURE COMPLETE PO LIQD
237.0000 mL | Freq: Two times a day (BID) | ORAL | Status: DC
  Administered 2013-06-06 – 2013-06-10 (×6): 237 mL via ORAL

## 2013-06-05 MED ORDER — CLONAZEPAM 0.5 MG PO TABS
0.2500 mg | ORAL_TABLET | Freq: Two times a day (BID) | ORAL | Status: DC
  Administered 2013-06-05 – 2013-06-06 (×2): 0.25 mg via ORAL
  Filled 2013-06-05 (×2): qty 1

## 2013-06-05 MED ORDER — LOSARTAN POTASSIUM 25 MG PO TABS
25.0000 mg | ORAL_TABLET | Freq: Every day | ORAL | Status: DC
  Administered 2013-06-05 – 2013-06-06 (×2): 25 mg via ORAL
  Filled 2013-06-05 (×2): qty 1

## 2013-06-05 MED ORDER — ALPRAZOLAM 0.5 MG PO TABS
0.5000 mg | ORAL_TABLET | Freq: Three times a day (TID) | ORAL | Status: DC | PRN
  Administered 2013-06-05 – 2013-06-07 (×3): 0.5 mg via ORAL
  Filled 2013-06-05 (×3): qty 1

## 2013-06-05 MED ORDER — PREDNISONE 50 MG PO TABS
60.0000 mg | ORAL_TABLET | Freq: Every day | ORAL | Status: DC
  Administered 2013-06-07 – 2013-06-10 (×3): 60 mg via ORAL
  Filled 2013-06-05 (×6): qty 1

## 2013-06-05 NOTE — Progress Notes (Signed)
Pt transferred to 5W at 10:55 via wheelchair with another RN. All belongings sent with daughter and patient. No complaints of distress. No distress noted at this time. Tonya [name], [name] I

## 2013-06-05 NOTE — Evaluation (Signed)
Occupational Therapy Evaluation Patient Details Name: Tonya [name] MRN: [id number] DOB: 1922-12-09 Today's Date: 06/05/2013 Time: [id number] OT Time Calculation (min): 30 min  OT Assessment / Plan / Recommendation History of present illness pt was admitted for COPD exacerbation   Clinical Impression   Pt is a resident of [facility].  She is legally blind an HOH, L ear better.  Pt hires assistance 3 hours per day for adls (washes with a pan of water) and to bring lunch up.  She is in an unfamiliar environment and may be a little weaker than baseline.  Will follow in acute.      OT Assessment  Patient needs continued OT Services    Follow Up Recommendations  No OT follow up;Home health OT (depending upon progress)    Barriers to Discharge      Equipment Recommendations  None recommended by OT    Recommendations for Other Services    Frequency  Min 2X/week    Precautions / Restrictions Precautions Precautions: Fall Restrictions Weight Bearing Restrictions: No   Pertinent Vitals/Pain No pain.  On 2 liters 02.  sats 93- 97%    ADL  Eating/Feeding: Set up Where Assessed - Eating/Feeding: Chair Grooming: Set up;Wash/dry hands;Wash/dry face Where Assessed - Grooming: Unsupported sitting Upper Body Bathing: Set up Where Assessed - Upper Body Bathing: Unsupported sitting Upper Body Dressing: Minimal assistance (iv) Where Assessed - Upper Body Dressing: Unsupported sitting Toilet Transfer: Min guard;Simulated Toilet Transfer Method: Stand pivot Toileting - Clothing Manipulation and Hygiene: Maximal assistance Where Assessed - Engineer, miningToileting Clothing Manipulation and Hygiene: Sit to stand from 3-in-1 or toilet Equipment Used: Standard walker Transfers/Ambulation Related to ADLs: performed spt to chair.  Bed mobility, supervision ADL Comments: pt hires help for LB adls.  She can perform UB and gets to commode herself during the day.  She usually only uses RW  outside of room.  Pt has tremor and often picks food up with fingers.  Legally blind    OT Diagnosis: Generalized weakness  OT Problem List: Decreased strength;Decreased activity tolerance;Decreased coordination;Decreased knowledge of use of DME or AE OT Treatment Interventions: Self-care/ADL training;DME and/or AE instruction;Patient/family education   OT Goals(Current goals can be found in the care plan section) Acute Rehab OT Goals OT Goal Formulation: With patient Time For Goal Achievement: 06/19/13 Potential to Achieve Goals: Good ADL Goals Pt Will Transfer to Toilet: with supervision;ambulating (high commode) Pt Will Perform Toileting - Clothing Manipulation and hygiene: with supervision;sit to/from stand  Visit Information  Last OT Received On: 06/05/13 Assistance Needed: +1 History of Present Illness: pt was admitted for COPD exacerbation       Prior Functioning     Home Living Family/patient expects to be discharged to::  ([facility]) Prior Function Level of Independence: Needs assistance Comments: hires assistance 3 hours/day for adls and to bring lunch back to apt Communication Communication: HOH (L ear best; wears hearing aid)         Vision/Perception Vision - History Baseline Vision: Legally blind   Cognition  Cognition Arousal/Alertness: Awake/alert Behavior During Therapy: WFL for tasks assessed/performed Overall Cognitive Status: Within Functional Limits for tasks assessed    Extremity/Trunk Assessment Upper Extremity Assessment Upper Extremity Assessment: Overall WFL for tasks assessed has bil tremor    Mobility Bed Mobility Overal bed mobility:  (supervision) Transfers Overall transfer level:  (supervision)     Exercise     Balance     End of Session OT - End of  Session Activity Tolerance: Patient tolerated treatment well Patient left: in chair;with call bell/phone within reach Nurse Communication: Mobility status  (chair alarm not under pt)  GO     Tonya [name] 06/05/2013, 9:30 AM Marica Otter, OTR/L [phone] 06/05/2013

## 2013-06-05 NOTE — Progress Notes (Addendum)
[name] PROGRESS NOTE  Tonya NestleKathleen W [name] ZOX:[id number] DOB: 1922-10-10 DOA: 06/03/2013 PCP: Nadean CorwinMCKEOWN,WILLIAM DAVID, MD  Assessment/Plan  Acute on chronic hypoxic respiratory failure likely secondary to acute COPD exacerbation.  She may have some diastolic heart failure.  Still has increased work of breathing. -  Continue BID Solu-Medrol today and transition to prednisone tomorrow -  Continue DuoNeb -  Continue Symbicort -  Levofloxacin day 3/3 -  Change guaifenesin tabs to sol'n with codeine   Possible acute diastolic heart failure, proBNP:  331.  Adequately diuresed now with mild elevation of BUN and mild hyponatremia.  Hold diuretics tomorrow   Mild dehydration, in part from diuresis and also poor oral intake last few days -  Gentle hydration overnight  MAT and premature atrial beats, likely secondary to respirator distress and bronchodilators and transitioned to NSR on telemetry.  Telemetry now discontinued.    Hypertension, blood pressure currently within normal limits -  Continue BP medications  Normocytic anemia, hemoglobin trending down slightly. Defer to primary care doctor. Thrombocytopenia, mild without evidence of bleeding. Likely acute phase reactant and stable today Leukocytosis, likely secondary to steroids.    Moderate protein-calorie malnutrition -  Start ensure -  Diet regular  Anxiety, severe today per family member, but has been ongoing problem.  Agreed to try more long-acting benzodiazepine to avoid peaks and troughs of xanax -  Start clonazepam 0.25mg  bid  -  Change xanax to TID prn  Diet:  Regular Access:  PIV IVF:  Off Proph:  Lovenox  Code Status: DO NOT RESUSCITATE Family Communication: Patient alone Disposition Plan: Pending improvement in respiratory distress, PT/OT recommending SNF.  Consult placed   Consultants:  None  Procedures:  Chest x-ray  Antibiotics:  Levofloxacin from 1/16   HPI/Subjective:  Patient states  that she still feels Lashan Gluth of breath, persistent cough.  Feels thirsty.  She feels very tired and fatigued.    Objective: Filed Vitals:   06/05/13 0700 06/05/13 0945 06/05/13 1435 06/05/13 1540  BP: 131/65  138/70   Pulse: 78  90   Temp: 97.8 F (36.6 C)  98.4 F (36.9 C)   TempSrc: Oral  Oral   Resp: 22  20   Height:      Weight: 69.2 kg (152 lb 8.9 oz)     SpO2: 100% 97% 98% 97%    Intake/Output Summary (Last 24 hours) at 06/05/13 1636 Last data filed at 06/05/13 1435  Gross per 24 hour  Intake    240 ml  Output   1575 ml  Net  -1335 ml   Filed Weights   06/03/13 0417 06/04/13 0629 06/05/13 0700  Weight: 65.998 kg (145 lb 8 oz) 67.903 kg (149 lb 11.2 oz) 69.2 kg (152 lb 8.9 oz)    Exam:   General:  [name], mild respiratory distress with intermittent SCM and supraclavicular retractions  HEENT:  NCAT, MMM  Cardiovascular:  RRR, nl S1, S2 no mrg, 2+ pulses, warm extremities  Respiratory:  Rales at the bilateral bases. Full expiratory wheeze and diminished at the bases, positive rhonchi, only mild WOB today  Abdomen:   NABS, soft, NT/ND  MSK:   Normal tone and bulk, no LEE  Neuro:  Grossly intact  Data Reviewed: Basic Metabolic Panel:  Recent Labs Lab 06/03/13 0147 06/03/13 0528 06/04/13 0615 06/05/13 0500  NA 136* 137 133* 130*  K 4.5 3.8 4.8 5.2  CL 95* 96 94* 90*  CO2 28 29 30 31   GLUCOSE 151*  189* 129* 154*  BUN 13 13 26* 26*  CREATININE 0.69 0.75 0.85 0.79  CALCIUM 9.2 8.9 8.7 8.5   Liver Function Tests:  Recent Labs Lab 06/03/13 0147 06/03/13 0528  AST 34 21  ALT 23 20  ALKPHOS 62 58  BILITOT 0.4 0.4  PROT 6.9 6.2  ALBUMIN 3.7 3.2*   No results found for this basename: LIPASE, AMYLASE,  in the last 168 hours No results found for this basename: AMMONIA,  in the last 168 hours CBC:  Recent Labs Lab 06/03/13 0147 06/03/13 0528 06/04/13 0615  WBC 11.3* 10.2 14.8*  NEUTROABS 9.5*  --   --   HGB 11.7* 10.2* 9.7*  HCT  34.4* 30.6* 29.2*  MCV 93.7 94.7 93.9  PLT 148* 121* 121*   Cardiac Enzymes:  Recent Labs Lab 06/03/13 0147  TROPONINI <0.30   BNP (last 3 results)  Recent Labs  06/03/13 0147  PROBNP 331.7   CBG: No results found for this basename: GLUCAP,  in the last 168 hours  Recent Results (from the past 240 hour(s))  MRSA PCR SCREENING     Status: None   Collection Time    06/03/13  5:28 AM      Result Value Range Status   MRSA by PCR NEGATIVE  NEGATIVE Final   Comment:            The GeneXpert MRSA Assay (FDA     approved for NASAL specimens     only), is one component of a     comprehensive MRSA colonization     surveillance program. It is not     intended to diagnose MRSA     infection nor to guide or     monitor treatment for     MRSA infections.     Studies: No results found.  Scheduled Meds: . ALPRAZolam  0.5 mg Oral TID  . aspirin EC  81 mg Oral Daily  . budesonide-formoterol  2 puff Inhalation BID  . docusate sodium  100 mg Oral BID  . enoxaparin (LOVENOX) injection  40 mg Subcutaneous Q24H  . furosemide  40 mg Intravenous Daily  . guaiFENesin  600 mg Oral BID  . ipratropium  0.5 mg Nebulization Q6H  . latanoprost  1 drop Right Eye QHS  . levalbuterol  0.63 mg Nebulization Q6H WA  . levofloxacin (LEVAQUIN) IV  750 mg Intravenous Q48H  . losartan  25 mg Oral Daily  . methylPREDNISolone (SOLU-MEDROL) injection  60 mg Intravenous Q12H  . montelukast  10 mg Oral QHS  . senna  2 tablet Oral QHS  . simvastatin  40 mg Oral q1800  . sodium chloride  3 mL Intravenous Q12H  . white petrolatum   Topical BID   Continuous Infusions:   Principal Problem:   COPD exacerbation Active Problems:   HYPERTENSION   Atrial fibrillation   Normocytic anemia   Thrombocytopenia, unspecified    Time spent: 30 min    Lamica Mccart, [facility]  [name] Pager [phone]. If 7PM-7AM, please contact night-coverage at [email or web address], password [facility] 06/05/2013, 4:36 PM  LOS:  2 days

## 2013-06-05 NOTE — Evaluation (Signed)
Physical Therapy Evaluation Patient Details Name: Tonya [name] MRN: [id number] DOB: [birth year] Today's Date: 06/05/2013 Time: 1433-1500 PT Time Calculation (min): 27 min  PT Assessment / Plan / Recommendation History of Present Illness  80 yo female admitted with COPD exac, weakness. Hx of emphysema, HTN. [facility] is from [facility].   Clinical Impression  On eval, pt required Min assist for mobility-able to ambulate ~50 feet with walker however pt required rest break after only ~25 feet. Pt demonstrates general weakness, decreased activity tolerance, and impaired gait and balance. Family present during session-still undecided about d/c plans but fairly open to idea of ST rehab (pt tends to defer to family for decision-making). At this time, recommend ST rehab at SNF prior to returning to [facility] unless family decides to arrange for 24/7 care.     PT Assessment  Patient needs continued PT services    Follow Up Recommendations  SNF;Supervision/Assistance - 24 hour    Does the patient have the potential to tolerate intense rehabilitation      Barriers to Discharge        Equipment Recommendations  None recommended by PT    Recommendations for Other Services OT consult   Frequency Min 3X/week    Precautions / Restrictions Precautions Precautions: Fall Restrictions Weight Bearing Restrictions: No   Pertinent Vitals/Pain 95% 2L O2, 92 bpm 93% on 3L O2, 119 bpm (pt uses 3L with activity) 96% 2L O2, 98 bpm      Mobility  Bed Mobility Overal bed mobility: Needs Assistance Bed Mobility: Sit to Supine Sit to supine: Supervision Transfers Overall transfer level: Needs assistance Transfers: Sit to/from Stand Sit to Stand: Min assist General transfer comment: assist to rise, stabilize, control descent. VCs safety, technique, hand placement Ambulation/Gait Ambulation/Gait assistance: Min assist Ambulation Distance (Feet): 50 Feet Assistive device: Rolling walker (2  wheeled) General Gait Details: slow gait speed. fatigues easily and pt is unsteady. 1 short standing rest break needed after ~25 feet.     Exercises     PT Diagnosis: Difficulty walking;Generalized weakness  PT Problem List: Decreased strength;Decreased range of motion;Decreased activity tolerance;Decreased balance;Decreased mobility;Decreased knowledge of use of DME PT Treatment Interventions: DME instruction;Gait training;Functional mobility training;Therapeutic activities;Therapeutic exercise;Patient/family education     PT Goals(Current goals can be found in the care plan section) Acute Rehab PT Goals Patient Stated Goal: home but pt is aware of her decreased ability to function presently PT Goal Formulation: With patient/family Time For Goal Achievement: 06/19/13 Potential to Achieve Goals: Good  Visit Information  Last PT Received On: 06/05/13 Assistance Needed: +1 History of Present Illness: 80 yo female admitted with COPD exac, weakness. Hx of emphysema, HTN. [facility] is from [facility].        Prior Functioning  Home Living Family/patient expects to be discharged to:: Unsure Living Arrangements: Alone Type of Home: Independent living facility Home Access: Level entry Home Layout: One level Home Equipment: Walker - 4 wheels Prior Function Level of Independence: Needs assistance Comments: hires assistance 3 hours/day for adls and to bring lunch back to apt Communication Communication: HOH    Cognition  Cognition Arousal/Alertness: Awake/alert Behavior During Therapy: WFL for tasks assessed/performed Overall Cognitive Status: Within Functional Limits for tasks assessed    Extremity/Trunk Assessment Upper Extremity Assessment Upper Extremity Assessment: Defer to OT evaluation Lower Extremity Assessment Lower Extremity Assessment: Generalized weakness Cervical / Trunk Assessment Cervical / Trunk Assessment: Normal   Balance    End of Session PT - End of  Session Equipment Utilized During Treatment: Gait belt;Oxygen Activity Tolerance: Patient limited by fatigue Patient left: in bed;with call bell/phone within reach;with family/visitor present  GP     Rebeca Alert, MPT Pager: [phone]

## 2013-06-06 DIAGNOSIS — J441 Chronic obstructive pulmonary disease with (acute) exacerbation: Secondary | ICD-10-CM

## 2013-06-06 DIAGNOSIS — E871 Hypo-osmolality and hyponatremia: Secondary | ICD-10-CM

## 2013-06-06 LAB — CBC
HCT: 34.6 % — ABNORMAL LOW (ref 36.0–46.0)
HEMOGLOBIN: 11.7 g/dL — AB (ref 12.0–15.0)
MCH: 31.8 pg (ref 26.0–34.0)
MCHC: 33.8 g/dL (ref 30.0–36.0)
MCV: 94 fL (ref 78.0–100.0)
PLATELETS: 185 10*3/uL (ref 150–400)
RBC: 3.68 MIL/uL — AB (ref 3.87–5.11)
RDW: 12.6 % (ref 11.5–15.5)
WBC: 16.2 10*3/uL — ABNORMAL HIGH (ref 4.0–10.5)

## 2013-06-06 LAB — BASIC METABOLIC PANEL
BUN: 26 mg/dL — ABNORMAL HIGH (ref 6–23)
CALCIUM: 8.4 mg/dL (ref 8.4–10.5)
CO2: 34 mEq/L — ABNORMAL HIGH (ref 19–32)
Chloride: 94 mEq/L — ABNORMAL LOW (ref 96–112)
Creatinine, Ser: 0.8 mg/dL (ref 0.50–1.10)
GFR calc non Af Amer: 64 mL/min — ABNORMAL LOW (ref >90)
GFR, EST AFRICAN AMERICAN: 74 mL/min — AB (ref >90)
GLUCOSE: 110 mg/dL — AB (ref 70–99)
POTASSIUM: 4.8 meq/L (ref 3.7–5.3)
SODIUM: 135 meq/L — AB (ref 137–147)

## 2013-06-06 MED ORDER — GUAIFENESIN ER 600 MG PO TB12
1200.0000 mg | ORAL_TABLET | Freq: Two times a day (BID) | ORAL | Status: DC
  Administered 2013-06-06 – 2013-06-07 (×2): 1200 mg via ORAL
  Filled 2013-06-06 (×3): qty 2

## 2013-06-06 MED ORDER — TIOTROPIUM BROMIDE MONOHYDRATE 18 MCG IN CAPS
18.0000 ug | ORAL_CAPSULE | Freq: Every day | RESPIRATORY_TRACT | Status: DC
  Filled 2013-06-06: qty 5

## 2013-06-06 MED ORDER — ARFORMOTEROL TARTRATE 15 MCG/2ML IN NEBU
15.0000 ug | INHALATION_SOLUTION | Freq: Two times a day (BID) | RESPIRATORY_TRACT | Status: DC
  Administered 2013-06-06 – 2013-06-10 (×8): 15 ug via RESPIRATORY_TRACT
  Filled 2013-06-06 (×11): qty 2

## 2013-06-06 MED ORDER — BUDESONIDE 0.5 MG/2ML IN SUSP
0.5000 mg | Freq: Two times a day (BID) | RESPIRATORY_TRACT | Status: DC
  Administered 2013-06-06 – 2013-06-10 (×8): 0.5 mg via RESPIRATORY_TRACT
  Filled 2013-06-06 (×15): qty 2

## 2013-06-06 MED ORDER — FUROSEMIDE 40 MG PO TABS
40.0000 mg | ORAL_TABLET | Freq: Two times a day (BID) | ORAL | Status: DC
  Administered 2013-06-07: 40 mg via ORAL
  Filled 2013-06-06 (×3): qty 1

## 2013-06-06 MED ORDER — SALINE SPRAY 0.65 % NA SOLN
1.0000 | Freq: Two times a day (BID) | NASAL | Status: DC
  Administered 2013-06-06 – 2013-06-10 (×8): 1 via NASAL
  Filled 2013-06-06: qty 44

## 2013-06-06 MED ORDER — BISOPROLOL FUMARATE 5 MG PO TABS
2.5000 mg | ORAL_TABLET | Freq: Every day | ORAL | Status: DC
  Administered 2013-06-06 – 2013-06-07 (×2): 2.5 mg via ORAL
  Filled 2013-06-06 (×3): qty 0.5

## 2013-06-06 MED ORDER — CLONAZEPAM 0.5 MG PO TABS
0.5000 mg | ORAL_TABLET | Freq: Two times a day (BID) | ORAL | Status: DC
Start: 2013-06-06 — End: 2013-06-07
  Administered 2013-06-06 – 2013-06-07 (×2): 0.5 mg via ORAL
  Filled 2013-06-06 (×2): qty 1

## 2013-06-06 MED ORDER — METOPROLOL TARTRATE 1 MG/ML IV SOLN
5.0000 mg | Freq: Four times a day (QID) | INTRAVENOUS | Status: DC | PRN
  Filled 2013-06-06: qty 5

## 2013-06-06 MED ORDER — LEVALBUTEROL HCL 0.63 MG/3ML IN NEBU
0.6300 mg | INHALATION_SOLUTION | RESPIRATORY_TRACT | Status: DC | PRN
  Administered 2013-06-06 – 2013-06-08 (×5): 0.63 mg via RESPIRATORY_TRACT
  Filled 2013-06-06 (×3): qty 3

## 2013-06-06 MED ORDER — PANTOPRAZOLE SODIUM 40 MG PO TBEC
40.0000 mg | DELAYED_RELEASE_TABLET | Freq: Two times a day (BID) | ORAL | Status: DC
  Administered 2013-06-06 – 2013-06-07 (×2): 40 mg via ORAL
  Filled 2013-06-06 (×5): qty 1

## 2013-06-06 MED ORDER — FLUTICASONE PROPIONATE 50 MCG/ACT NA SUSP
2.0000 | Freq: Every day | NASAL | Status: DC
  Administered 2013-06-06 – 2013-06-10 (×5): 2 via NASAL
  Filled 2013-06-06: qty 16

## 2013-06-06 NOTE — Progress Notes (Signed)
Clinical Social Work Department CLINICAL SOCIAL WORK PLACEMENT NOTE 06/06/2013  Patient:  Tonya [name],Tonya [name]  Account Number:  [id number] Admit date:  06/03/2013  Clinical Social Worker:  Cori RazorJAMIE Shelvy Heckert, LCSW  Date/time:  06/06/2013 02:12 PM  Clinical Social Work is seeking post-discharge placement for this patient at the following level of care:   SKILLED NURSING   (*CSW will update this form in Epic as items are completed)   06/06/2013  Patient/family provided with [facility]'s list of facilities offering this level of care within the geographic area requested by the patient (or if unable, by the patient's family).  06/06/2013  Patient/family informed of their freedom to choose among providers that offer the needed level of care, that participate in Medicare, Medicaid or managed care program needed by the patient, have an available bed and are willing to accept the patient.    Patient/family informed of [facility]' ownership interest in [facility], as well as of the fact that they are under no obligation to receive care at this facility.  PASARR submitted to EDS on 06/06/2013 PASARR number received from [employer] on 06/06/2013  FL2 transmitted to all facilities in geographic area requested by pt/family on  06/06/2013 FL2 transmitted to all facilities within larger geographic area on   Patient informed that his/her managed care company has contracts with or will negotiate with  certain facilities, including the following:     Patient/family informed of bed offers received:  06/06/2013 Patient chooses bed at  Physician recommends and patient chooses bed at    Patient to be transferred to  on   Patient to be transferred to facility by   The following physician request were entered in Epic:   Additional Comments:  Cori RazorJamie Jariel Drost LCSW [phone]

## 2013-06-06 NOTE — Progress Notes (Signed)
Occupational Therapy Treatment Patient Details Name: Tonya NestleKathleen W Mudgett MRN: [id number] DOB: [birth year] Today's Date: 06/06/2013 Time: 1005-1056 OT Time Calculation (min): 51 min  OT Assessment / Plan / Recommendation  History of present illness 80 yo female admitted with COPD exac, weakness. Hx of emphysema, HTN. [facility].    OT comments  Pt tolerating ADL with multiple rest breaks interspersed throughout activity. ADL mobility/transfers limited by poor vision.  Pt really needs 24 hour care.  She has a caregiver only 3 hours a day that helps with meals, bathing and dressing.    Follow Up Recommendations  SNF;Supervision/Assistance - 24 hour    Barriers to Discharge       Equipment Recommendations  None recommended by OT    Recommendations for Other Services    Frequency Min 2X/week   Progress towards OT Goals Progress towards OT goals: Progressing toward goals  Plan Discharge plan needs to be updated    Precautions / Restrictions Precautions Precautions: Fall Precaution Comments: low vision Restrictions Weight Bearing Restrictions: No   Pertinent Vitals/Pain HR 91, 02 99% upon return to chair and rest while OT retrieved dynamap (recovers quickly)    ADL  Eating/Feeding: Set up (trial use of weighted/built up fork) Where Assessed - Eating/Feeding: Chair Grooming: Set up;Wash/dry hands;Wash/dry face Where Assessed - Grooming: Unsupported sitting Upper Body Bathing: Set up Where Assessed - Upper Body Bathing: Unsupported sitting Lower Body Bathing: Moderate assistance Where Assessed - Lower Body Bathing: Unsupported sitting;Supported sit to stand Upper Body Dressing: Set up Where Assessed - Upper Body Dressing: Unsupported sitting Lower Body Dressing: Maximal assistance Where Assessed - Lower Body Dressing: Unsupported sitting;Supported sit to stand Toilet Transfer: Min Pension scheme managerguard Toilet Transfer Method: Sit to [place]ist[place] Toilet Transfer Equipment: Comfort height  toilet;Grab bars Toileting - ArchitectClothing Manipulation and Hygiene: Supervision/safety Where Assessed - Engineer, miningToileting Clothing Manipulation and Hygiene: Sit to stand from 3-in-1 or toilet Equipment Used: Rolling walker Transfers/Ambulation Related to ADLs: min guard with RW, min to ambulate to bathroom ADL Comments: Uses fingers to eat.  Attempted use of weighted fork without improvement. Instructed in purse lip breathing.    OT Diagnosis:    OT Problem List:   OT Treatment Interventions:     OT Goals(current goals can now be found in the care plan section) Acute Rehab OT Goals Patient Stated Goal: home but pt is aware of her decreased ability to function presently  Visit Information  Last OT Received On: 06/06/13 Assistance Needed: +1 History of Present Illness: 80 yo female admitted with COPD exac, weakness. Hx of emphysema, HTN. [facility] is from [facility].     Subjective Data      Prior Functioning       Cognition  Cognition Arousal/Alertness: Awake/alert Behavior During Therapy: WFL for tasks assessed/performed Overall Cognitive Status: Within Functional Limits for tasks assessed    Mobility  Transfers Overall transfer level: Needs assistance Transfers: Sit to/from Stand Sit to Stand: Supervision General transfer comment: min guard assist from and to chair and 3 in1 over toilet    Exercises      Balance    End of Session OT - End of Session Activity Tolerance: Patient tolerated treatment well Patient left: in chair;with call bell/phone within reach;with family/visitor present;with nursing/sitter in room  GO     Evern BioMayberry, Audie Wieser Lynn 06/06/2013, 12:05 PM [phone]

## 2013-06-06 NOTE — Progress Notes (Signed)
[name] PROGRESS NOTE  Tonya [name] ZOX:[id number] DOB: [birth year] DOA: 06/03/2013 PCP: Nadean Corwin, MD  Assessment/Plan  Acute on chronic hypoxic respiratory failure likely secondary to acute COPD exacerbation.  Persistent increased work of breathing. -  Transition to prednisone today because steroids are worsening her delirium -  D/c symbicort -  Start pulmicort BID -  Start brovana -  D/c ARB -  Continue q6h ipra + xopenex -  S/p levofloxacin x 3 days -  Start BID PPI -  Speech therapy to eval for aspiration -  Continue guaifenesin tabs to sol'n with codeine  -  Pulmology consult, previously seen by Dr. Delford Field -  PFTs: 12/05/2009:  FEV1 %Predicted: 33.30  FEV1/FVC %Predicted: 72.80  FVC %Predicted: 46.40  10/02/2009:  FEV1 %Predicted: 33.70  FEV1/FVC %Predicted: 63.90  FVC %Predicted: 53.50  Possible acute diastolic heart failure, proBNP:  331.  Was somewhat pre-renal but resolving -  Restart diuretics tomorrow  Mild dehydration suggested by hyponatremia, in part from diuresis and also poor oral intake last few days, resolving with hydration. -  D/c IVF   MAT and premature atrial beats, likely secondary to respirator distress and bronchodilators and transitioned to NSR on telemetry.  Telemetry now discontinued.    Hypertension, blood pressure now elevated, possibly due to steroids -  D/c ARB -  Restart lasix -  Allergy to CCB -  Avoid hydralazine 2/2 recent MAT -  Start bisoprolol -  Add prn metoprolol for SBP > 160  Normocytic anemia, hemoglobin trending down slightly.  -  Repeat CBC in AM  Thrombocytopenia, mild without evidence of bleeding. Likely acute phase reactant and previously stable -  Repeat CBC in AM  Leukocytosis, likely secondary to steroids.   -  Repeat CBC in AM  Moderate protein-calorie malnutrition, eating well now per family  -  Start ensure -  Diet regular  Anxiety, severe today per family member, but has been  ongoing problem.  Wonder if this is related to breathing -  Increase clonazepam to 0.5mg  bid and continue for a few days -  Continue weaning xanax to TID prn  Diet:  Regular Access:  PIV IVF:  Off Proph:  Lovenox  Code Status: DO NOT RESUSCITATE Family Communication: Patient alone Disposition Plan: Pending improvement in respiratory distress, PT/OT recommending SNF.  Consult placed   Consultants:  None  Procedures:  Chest x-ray  Antibiotics:  Levofloxacin from 1/16   HPI/Subjective:  Still anxious and SOB with exertion, although was able to walk farther down the hall today.  Frustrated by her breathing.  Objective: Filed Vitals:   06/06/13 0223 06/06/13 0518 06/06/13 1303 06/06/13 1404  BP:  153/71  166/67  Pulse:  84  96  Temp:  98.3 F (36.8 C)  98.8 F (37.1 C)  TempSrc:  Oral  Oral  Resp:  18  18  Height:      Weight: 67.813 kg (149 lb 8 oz)     SpO2:  100% 98% 98%    Intake/Output Summary (Last 24 hours) at 06/06/13 1415 Last data filed at 06/06/13 1400  Gross per 24 hour  Intake    460 ml  Output   1150 ml  Net   -690 ml   Filed Weights   06/04/13 0629 06/05/13 0700 06/06/13 0223  Weight: 67.903 kg (149 lb 11.2 oz) 69.2 kg (152 lb 8.9 oz) 67.813 kg (149 lb 8 oz)    Exam:   General:  [name], mild respiratory  distress with SCM, intercostal, subcostal, and supraclavicular retractions  HEENT:  NCAT, MMM  Cardiovascular:  RRR, nl S1, S2 no mrg, 2+ pulses, warm extremities  Respiratory:  Rales at the bilateral bases, Full expiratory wheeze and diminished at the bases, positive rhonchi, mild increased WOB  Abdomen:   NABS, soft, NT/ND  MSK:   Normal tone and bulk, no LEE  Neuro:  Grossly intact  Data Reviewed: Basic Metabolic Panel:  Recent Labs Lab 06/03/13 0147 06/03/13 0528 06/04/13 0615 06/05/13 0500 06/06/13 0500  NA 136* 137 133* 130* 135*  K 4.5 3.8 4.8 5.2 4.8  CL 95* 96 94* 90* 94*  CO2 28 29 30 31  34*  GLUCOSE  151* 189* 129* 154* 110*  BUN 13 13 26* 26* 26*  CREATININE 0.69 0.75 0.85 0.79 0.80  CALCIUM 9.2 8.9 8.7 8.5 8.4   Liver Function Tests:  Recent Labs Lab 06/03/13 0147 06/03/13 0528  AST 34 21  ALT 23 20  ALKPHOS 62 58  BILITOT 0.4 0.4  PROT 6.9 6.2  ALBUMIN 3.7 3.2*   No results found for this basename: LIPASE, AMYLASE,  in the last 168 hours No results found for this basename: AMMONIA,  in the last 168 hours CBC:  Recent Labs Lab 06/03/13 0147 06/03/13 0528 06/04/13 0615  WBC 11.3* 10.2 14.8*  NEUTROABS 9.5*  --   --   HGB 11.7* 10.2* 9.7*  HCT 34.4* 30.6* 29.2*  MCV 93.7 94.7 93.9  PLT 148* 121* 121*   Cardiac Enzymes:  Recent Labs Lab 06/03/13 0147  TROPONINI <0.30   BNP (last 3 results)  Recent Labs  06/03/13 0147  PROBNP 331.7   CBG: No results found for this basename: GLUCAP,  in the last 168 hours  Recent Results (from the past 240 hour(s))  MRSA PCR SCREENING     Status: None   Collection Time    06/03/13  5:28 AM      Result Value Range Status   MRSA by PCR NEGATIVE  NEGATIVE Final   Comment:            The GeneXpert MRSA Assay (FDA     approved for NASAL specimens     only), is one component of a     comprehensive MRSA colonization     surveillance program. It is not     intended to diagnose MRSA     infection nor to guide or     monitor treatment for     MRSA infections.     Studies: No results found.  Scheduled Meds: . arformoterol  15 mcg Nebulization BID  . aspirin EC  81 mg Oral Daily  . benzonatate  100 mg Oral TID  . budesonide (PULMICORT) nebulizer solution  0.5 mg Nebulization BID  . clonazePAM  0.5 mg Oral BID  . docusate sodium  100 mg Oral BID  . enoxaparin (LOVENOX) injection  40 mg Subcutaneous Q24H  . feeding supplement (ENSURE COMPLETE)  237 mL Oral BID BM  . ipratropium  0.5 mg Nebulization Q6H  . latanoprost  1 drop Right Eye QHS  . levalbuterol  0.63 mg Nebulization Q6H WA  . montelukast  10 mg Oral QHS   . pantoprazole  40 mg Oral BID AC  . predniSONE  60 mg Oral Q breakfast  . senna  2 tablet Oral QHS  . simvastatin  40 mg Oral q1800  . sodium chloride  3 mL Intravenous Q12H  . white petrolatum  Topical BID   Continuous Infusions:   Principal Problem:   COPD exacerbation Active Problems:   HYPERTENSION   Atrial fibrillation   Normocytic anemia   Thrombocytopenia, unspecified   Hyponatremia    Time spent: 30 min    Jaidev Sanger, [facility] MansfieldMACKENZIE  [name] Pager [phone]. If 7PM-7AM, please contact night-coverage at [email or web address], password [email or web address] 06/06/2013, 2:15 PM  LOS: 3 days

## 2013-06-06 NOTE — Progress Notes (Signed)
Physical Therapy Treatment Patient Details Name: Tonya [name] MRN: [id number] DOB: 1923/04/23 Today's Date: 06/06/2013 Time: 4098-11910950-1022 PT Time Calculation (min): 32 min  PT Assessment / Plan / Recommendation  History of Present Illness 80 yo female admitted with COPD exac, weakness. Hx of emphysema, HTN. [facility] from [facility].    PT Comments   Progressing slowly. Continues to get dyspneic with activity however pt was able to ambulate a total of ~90 feet today with 2 seated rest breaks. Dyspnea 3-4 with activity. O2 sats rebound quickly with rest.   Follow Up Recommendations  SNF;Supervision/Assistance - 24 hour     Does the patient have the potential to tolerate intense rehabilitation     Barriers to Discharge        Equipment Recommendations  None recommended by PT    Recommendations for Other Services OT consult  Frequency Min 3X/week   Progress towards PT Goals Progress towards PT goals: Progressing toward goals  Plan Current plan remains appropriate    Precautions / Restrictions Precautions Precautions: Fall Restrictions Weight Bearing Restrictions: No   Pertinent Vitals/Pain 96% 2L , 96 bpm at rest Ambulated on 3L O2 99% 2L, 89 bpm end of session    Mobility  Transfers Overall transfer level: Needs assistance General transfer comment: Min assist to control descent. Min guard to rise when pt uses correct hand placement. VCS safety, techniquem, hand placement Ambulation/Gait Ambulation/Gait assistance: Min assist Ambulation Distance (Feet): 90 Feet (15'x1, 40'x2) Assistive device: Rolling walker (2 wheeled) Gait Pattern/deviations: Step-through pattern General Gait Details: slow gait speed. fatigues easily and pt is unsteady. 2 short sitting rest breaks needed. Total of ~90 feet ambulation distance this session    Exercises     PT Diagnosis:    PT Problem List:   PT Treatment Interventions:     PT Goals (current goals can now be found in the care plan  section)    Visit Information  Last PT Received On: 06/06/13 Assistance Needed: +1 History of Present Illness: 80 yo female admitted with COPD exac, weakness. Hx of emphysema, HTN. [facility] is from [facility].     Subjective Data      Cognition  Cognition Arousal/Alertness: Awake/[name] Behavior During Therapy: WFL for tasks assessed/performed Overall Cognitive Status: Within Functional Limits for tasks assessed    Balance     End of Session PT - End of Session Equipment Utilized During Treatment: Gait belt;Oxygen Activity Tolerance: Patient limited by fatigue Patient left: in chair;with call bell/phone within reach;with family/visitor present Nurse Communication: Mobility status   GP     Tonya AlertJannie Mckinzey [name], MPT Pager: [phone]

## 2013-06-06 NOTE — Progress Notes (Signed)
Clinical Social Work Department BRIEF PSYCHOSOCIAL ASSESSMENT 06/06/2013  Patient:  Tonya [name], Tonya [name]     Account Number:  [id number]     Admit date:  06/03/2013  Clinical Social Worker:  Lacie Scotts  Date/Time:  06/06/2013 01:56 PM  Referred by:  Physician  Date Referred:  06/05/2013 Referred for  SNF Placement   Other Referral:   Interview type:  Patient Other interview type:    PSYCHOSOCIAL DATA Living Status:  FACILITY Admitted from facility:  [facility] Level of care:  [facility]dependent [facility] Primary support name:  Raina Mina Primary support relationship to patient:  CHILD, ADULT Degree of support available:   supportive    CURRENT CONCERNS Current Concerns  Post-Acute Placement   Other Concerns:    SOCIAL WORK ASSESSMENT / PLAN Pt is an 80 yr old female admitted from [place] [facility]. CSW met with pt to assist with d/c planning. PT/ OT have recommended ST Rehab following hospital d/c. Pt is willing to consider this option. SNF search has been initiated and bed offers will be provided this afternoon.   Assessment/plan status:  Psychosocial Support/Ongoing Assessment of Needs Other assessment/ plan:   Information/referral to community resources:   Insurance coverage for SNF was reviewed. SNF list with offers to be provided.    PATIENT'S/FAMILY'S RESPONSE TO PLAN OF CARE: " I'll go to rehab provided it doesn't cost me my SS check. I need my money to help support my family. " Pt plans to have rehab for less than 20 days.   Werner Lean LCSW [phone]

## 2013-06-06 NOTE — Consult Note (Signed)
Name: Tonya [name] MRN: [id number] DOB: May 10, 1923    ADMISSION DATE:  06/03/2013 CONSULTATION DATE:  06/06/2013  REFERRING MD :  Renae FickleMackenzie Short  CHIEF COMPLAINT:  Short of breath  BRIEF PATIENT DESCRIPTION:  80 yo female former smoker from assisted living facility with cough, sputum fever, and dyspnea from AECOPD.  She was slow to recover, and PCCM consulted.  She was previously followed by Dr. Delford FieldWright in pulmonary office.  SIGNIFICANT EVENTS: 1/16 Admit 1/19 PCCM consulted  STUDIES:  Spirometry 12/05/09 >> FEV1 0.63 (33%), FEV1% 53  LINES / TUBES: PIV  CULTURES: Influenza PCR 1/16 >> negative  ANTIBIOTICS: Tamiflu 1/15 >> 1/15 Levaquin 1/15 >>   HISTORY OF PRESENT ILLNESS:   80 yo female former smoker from assisted living facility with cough, sputum fever, and dyspnea from AECOPD.  She was slow to recover, and PCCM consulted.  She was previously followed by Dr. Delford FieldWright in pulmonary office.  She has persistent cough and chest congestion.  She still has wheeze.  She has trouble getting sputum up.  She is not having chest pain, but c/o hoarseness.  PAST MEDICAL HISTORY :  Past Medical History  Diagnosis Date  . Emphysema   . Hypertension   . Pneumonia    Past Surgical History  Procedure Laterality Date  . Abdominal hysterectomy    . Hernia repair      inguinal  . Elbow surgery     Prior to Admission medications   Medication Sig Start Date End Date Taking? Authorizing Provider  albuterol (PROVENTIL) (2.5 MG/3ML) 0.083% nebulizer solution Take 2.5 mg by nebulization every 6 (six) hours as needed for wheezing or shortness of breath.   Yes Historical Provider, MD  ALPRAZolam Prudy Feeler(XANAX) 0.5 MG tablet Take 0.5 mg by mouth 3 (three) times daily. For anxiety   Yes Historical Provider, MD  aspirin EC 81 MG tablet Take 81 mg by mouth daily.   Yes Historical Provider, MD  beta carotene w/minerals (OCUVITE) tablet Take 1 tablet by mouth daily.   Yes Historical Provider, MD    budesonide-formoterol (SYMBICORT) 160-4.5 MCG/ACT inhaler Inhale 2 puffs into the lungs 2 (two) times daily.   Yes Historical Provider, MD  cholecalciferol (VITAMIN D) 1000 UNITS tablet Take 1,000 Units by mouth daily.   Yes Historical Provider, MD  ferrous sulfate 325 (65 FE) MG tablet Take 325 mg by mouth daily with breakfast.   Yes Historical Provider, MD  furosemide (LASIX) 40 MG tablet Take 40 mg by mouth 2 (two) times daily.   Yes Historical Provider, MD  latanoprost (XALATAN) 0.005 % ophthalmic solution Place 1 drop into the right eye at bedtime.    Yes Historical Provider, MD  levalbuterol (XOPENEX) 0.63 MG/3ML nebulizer solution Take 3 mLs (0.63 mg total) by nebulization 3 (three) times daily. 05/07/12  Yes Sorin Luanne Bras Laza, MD  losartan (COZAAR) 50 MG tablet Take 50 mg by mouth daily.   Yes Historical Provider, MD  pravastatin (PRAVACHOL) 40 MG tablet Take 40 mg by mouth at bedtime.   Yes Historical Provider, MD  tiotropium (SPIRIVA) 18 MCG inhalation capsule Place 1 capsule (18 mcg total) into inhaler and inhale daily. 05/07/12  Yes Sorin Luanne Bras Laza, MD  zafirlukast (ACCOLATE) 20 MG tablet Take 20 mg by mouth 2 (two) times daily.   Yes Historical Provider, MD   Allergies  Allergen Reactions  . Ciprofloxacin     REACTION: nausea  . Erythromycin     REACTION: rash  . Penicillins  REACTION: nausea  . Verapamil     REACTION: nausea    FAMILY HISTORY:  Family History  Problem Relation Age of Onset  . Stroke Mother   . Kidney failure Father    SOCIAL HISTORY:  reports that she has quit smoking. Her smoking use included Cigarettes. She has a 30 pack-year smoking history. She does not have any smokeless tobacco history on file. She reports that she does not drink alcohol or use illicit drugs.  REVIEW OF SYSTEMS:   Negative except above.  SUBJECTIVE:   VITAL SIGNS: Temp:  [98.2 F (36.8 C)-98.8 F (37.1 C)] 98.8 F (37.1 C) (01/19 1404) Pulse Rate:  [84-100] 96 (01/19  1404) Resp:  [18-20] 18 (01/19 1404) BP: (138-168)/(67-78) 166/67 mmHg (01/19 1404) SpO2:  [94 %-100 %] 98 % (01/19 1404) Weight:  [149 lb 8 oz (67.813 kg)] 149 lb 8 oz (67.813 kg) (01/19 0223)  PHYSICAL EXAMINATION: General: Mild increased work of breathing Neuro:  Alert, normal strength HEENT:  Decreased hearing, decreased visual acuity, no sinus tenderness, no oral exudate, wheeze over neck Cardiovascular: regular, no murmur Lungs:  B/l expiratory wheezing, poor air movement Abdomen:  Soft, non tender, + bowel sounds Musculoskeletal:  Ankle edema Skin:  No rashes  Labs: CBC Recent Labs     06/04/13  0615  WBC  14.8*  HGB  9.7*  HCT  29.2*  PLT  121*   BMET Recent Labs     06/04/13  0615  06/05/13  0500  06/06/13  0500  NA  133*  130*  135*  K  4.8  5.2  4.8  CL  94*  90*  94*  CO2  30  31  34*  BUN  26*  26*  26*  CREATININE  0.85  0.79  0.80  GLUCOSE  129*  154*  110*    Electrolytes Recent Labs     06/04/13  0615  06/05/13  0500  06/06/13  0500  CALCIUM  8.7  8.5  8.4   Imaging No results found.    ASSESSMENT / PLAN:  A: AECOPD. P: -agree with change to pulmicort/brovana with prn xopenex  -resume spiriva -wean off steroids as tolerated -Abx per primary team -f/u CXR 1/20 -augment bronchial hygiene regimen  A: Acute on chronic hypoxic/hypercapnic respiratory failure. P: -oxygen to keep SpO2 > 90%  A: Upper airway cough syndrome. P: -add flonase -continue nasal irrigation -agree with addition of BID protonix for now -f/u swallow assessment  A: Hx of HTN with acute on chronic diastolic dysfx. P: -per primary team  A: Tachycardia. P: -per primary team  Goals of care >> DNR/DNI  Updated pt's family at bedside.  Coralyn Helling, MD [facility]/Critical [facility] 06/06/2013, 2:54 PM Pager:  [phone] After 3pm call: [phone]

## 2013-06-07 ENCOUNTER — Inpatient Hospital Stay (HOSPITAL_COMMUNITY): Payer: Medicare Other

## 2013-06-07 LAB — BASIC METABOLIC PANEL
BUN: 21 mg/dL (ref 6–23)
CALCIUM: 8.4 mg/dL (ref 8.4–10.5)
CO2: 34 meq/L — AB (ref 19–32)
CREATININE: 0.73 mg/dL (ref 0.50–1.10)
Chloride: 95 mEq/L — ABNORMAL LOW (ref 96–112)
GFR calc Af Amer: 86 mL/min — ABNORMAL LOW (ref >90)
GFR calc non Af Amer: 74 mL/min — ABNORMAL LOW (ref >90)
Glucose, Bld: 91 mg/dL (ref 70–99)
Potassium: 4.9 mEq/L (ref 3.7–5.3)
Sodium: 134 mEq/L — ABNORMAL LOW (ref 137–147)

## 2013-06-07 MED ORDER — MORPHINE SULFATE 15 MG PO TABS
15.0000 mg | ORAL_TABLET | ORAL | Status: DC | PRN
  Administered 2013-06-07 (×2): 15 mg via ORAL
  Filled 2013-06-07 (×2): qty 1

## 2013-06-07 MED ORDER — IPRATROPIUM BROMIDE 0.02 % IN SOLN: 0.5000 mg | RESPIRATORY_TRACT | Status: DC

## 2013-06-07 MED ORDER — ALPRAZOLAM 1 MG PO TABS
1.0000 mg | ORAL_TABLET | ORAL | Status: DC | PRN
  Administered 2013-06-07 – 2013-06-09 (×4): 1 mg via ORAL
  Filled 2013-06-07 (×4): qty 1

## 2013-06-07 MED ORDER — CLONAZEPAM 1 MG PO TABS: 1.0000 mg | ORAL_TABLET | Freq: Two times a day (BID) | ORAL | Status: DC

## 2013-06-07 MED ORDER — SIMETHICONE 80 MG PO CHEW
80.0000 mg | CHEWABLE_TABLET | Freq: Four times a day (QID) | ORAL | Status: DC | PRN
  Filled 2013-06-07: qty 1

## 2013-06-07 MED ORDER — PANTOPRAZOLE SODIUM 40 MG PO TBEC
40.0000 mg | DELAYED_RELEASE_TABLET | Freq: Every day | ORAL | Status: DC
  Administered 2013-06-08 – 2013-06-10 (×3): 40 mg via ORAL
  Filled 2013-06-07 (×3): qty 1

## 2013-06-07 MED ORDER — DOCUSATE SODIUM 100 MG PO CAPS
200.0000 mg | ORAL_CAPSULE | Freq: Two times a day (BID) | ORAL | Status: DC
  Administered 2013-06-07 – 2013-06-10 (×6): 200 mg via ORAL
  Filled 2013-06-07 (×7): qty 2

## 2013-06-07 MED ORDER — FUROSEMIDE 40 MG PO TABS
60.0000 mg | ORAL_TABLET | Freq: Every day | ORAL | Status: DC
  Administered 2013-06-08 – 2013-06-10 (×3): 60 mg via ORAL
  Filled 2013-06-07 (×4): qty 1

## 2013-06-07 MED ORDER — CLONAZEPAM 1 MG PO TABS
1.0000 mg | ORAL_TABLET | Freq: Every day | ORAL | Status: DC
  Administered 2013-06-07 – 2013-06-09 (×3): 1 mg via ORAL
  Filled 2013-06-07 (×3): qty 1

## 2013-06-07 MED ORDER — IPRATROPIUM BROMIDE 0.02 % IN SOLN
0.5000 mg | RESPIRATORY_TRACT | Status: DC
  Administered 2013-06-07 – 2013-06-08 (×2): 0.5 mg via RESPIRATORY_TRACT
  Filled 2013-06-07 (×2): qty 2.5

## 2013-06-07 MED ORDER — CLONAZEPAM 0.5 MG PO TABS
0.5000 mg | ORAL_TABLET | Freq: Every day | ORAL | Status: DC
  Administered 2013-06-08 – 2013-06-10 (×3): 0.5 mg via ORAL
  Filled 2013-06-07 (×3): qty 1

## 2013-06-07 MED ORDER — IPRATROPIUM BROMIDE 0.02 % IN SOLN: 0.5000 mg | Freq: Four times a day (QID) | RESPIRATORY_TRACT | Status: DC

## 2013-06-07 NOTE — Progress Notes (Signed)
Palliative Medicine Team consult was received. We will schedule a goals of care meeting with patient and her family at the earliest possible time we have a provider available. If there are urgent needs or questions please call [phone]. Thank you for consulting our team to assist with this patient's care.  Tonya MaltaElizabeth Marketa Midkiff, DO Palliative Medicine

## 2013-06-07 NOTE — Evaluation (Addendum)
Clinical/Bedside Swallow Evaluation Patient Details  Name: Tonya [name] MRN: [id number] Date of Birth: 08/02/1922  Today's Date: 06/07/2013 Time: 4098-11910840-0912 SLP Time Calculation (min): 32 min  Past Medical History:  Past Medical History  Diagnosis Date  . Emphysema   . Hypertension   . Pneumonia    Past Surgical History:  Past Surgical History  Procedure Laterality Date  . Abdominal hysterectomy    . Hernia repair      inguinal  . Elbow surgery     HPI:  80 [facility] cough, dyspnea, and sputum seconday to AECOPD.  Swallow evaluation ordered, possibly due to "upper airway cough syndrome".  Pt denies dysphagia but does admit to dyspnea chronically.  CXR was negative for acute dx.   Pt also adamantly denies issues with reflux dx and was surprised to be taking a PPI currently.    Assessment / Plan / Recommendation Clinical Impression  Pt without focal CN deficits impacting swallow musculature. She does admit to issues with taking large bites/sips (suspect vision impairment contributing) that may cause her to "choke" at times.  Pt observed with breakfast with cough after approx 20%of swallows. Accessory muscle use noted with inhalation after swallow which will  increase pt episodic asp risk.  Do not suspect pt is aspirating with each bolus, but episodic aspiration likely.  Educated pt to aspiration mitigation strategies given her respiratory status using teach back to confirm comprehension.  Alternative medication administration also discussed.    Recommend continue regular/thin diet with precautions.  SLP to sign off as all education completed.    Aspiration Risk  Moderate    Diet Recommendation Regular;Thin liquid   Liquid Administration via: Straw Medication Administration: Whole meds with puree Supervision: Patient able to self feed (set up assist) Compensations: Slow rate;Small sips/bites Postural Changes and/or Swallow Maneuvers: Seated upright  90 degrees;Upright 30-60 min after meal    Other  Recommendations Oral Care Recommendations: Oral care BID   Follow Up Recommendations  None    Frequency and Duration        Pertinent Vitals/Pain Afebrile, decreased         Swallow Study    General Date of Onset: 06/07/13 HPI: 80 [facility] cough, dyspnea, and sputum seconday to AECOPD.  Swallow evaluation ordered, possibly due to "upper airway cough syndrome".  Pt denies dyphagia but does admit to dyspnea chronically.  CXR was negative for acute dx.   Type of Study: Bedside swallow evaluation Diet Prior to this Study: Regular;Thin liquids Temperature Spikes Noted: No Respiratory Status: Nasal cannula History of Recent Intubation: No Behavior/Cognition: Alert;Cooperative;Pleasant mood Oral Cavity - Dentition: Adequate natural dentition Self-Feeding Abilities: Needs set up (pt is very vision and hearing impaired) Patient Positioning: Upright in bed Baseline Vocal Quality: Hoarse Volitional Cough: Strong Volitional Swallow: Able to elicit    Oral/Motor/Sensory Function Overall Oral Motor/Sensory Function: Appears within functional limits for tasks assessed   Ice Chips Ice chips: Not tested   Thin Liquid Thin Liquid: Within functional limits Presentation: Straw;Self Fed    Nectar Thick Nectar Thick Liquid: Not tested   Honey Thick Honey Thick Liquid: Not tested   Puree Puree: Not tested   Solid   GO    Solid: Within functional limits Presentation: Self Fed;Spoon       Mills KollerKimball, Arias Weinert Ann Citlali Gautney, MS [facility] SLP [phone]

## 2013-06-07 NOTE — Progress Notes (Signed)
Paged MD Short with patient's concern of not being able to sleep since Thursday last week, patient and patient's daughter also stated that the patient does not take spiriva and would like to have it discontinued, patient also stated that xopenex makes her head burn, awaiting callback Stanford BreedBracey, Trenita Hulme N RN 06-07-2013 11:18am

## 2013-06-07 NOTE — Consult Note (Signed)
Name: Tonya [name] MRN: [id number] DOB: Nov 19, 1922    ADMISSION DATE:  06/03/2013 CONSULTATION DATE:  06/06/2013  REFERRING MD :  Renae FickleMackenzie Short  CHIEF COMPLAINT:  Short of breath  BRIEF PATIENT DESCRIPTION:  80 yo female former smoker from assisted living facility with cough, sputum fever, and dyspnea from AECOPD.  She was slow to recover, and PCCM consulted.  She was previously followed by Dr. Delford FieldWright in pulmonary office.  SIGNIFICANT EVENTS: 1/16 Admit 1/19 PCCM consulted 1/20 Speech therapy swallow eval >> Regular diet, thin liquids  STUDIES:  Spirometry 12/05/09 >> FEV1 0.63 (33%), FEV1% 53  LINES / TUBES: PIV  CULTURES: Influenza PCR 1/16 >> negative  ANTIBIOTICS: Tamiflu 1/15 >> 1/15 Levaquin 1/15 >>   SUBJECTIVE:  Resting comfortably.  VITAL SIGNS: Temp:  [98.2 F (36.8 C)-98.8 F (37.1 C)] 98.2 F (36.8 C) (01/20 0557) Pulse Rate:  [72-96] 72 (01/20 0557) Resp:  [18] 18 (01/20 0557) BP: (134-166)/(57-69) 134/69 mmHg (01/20 0557) SpO2:  [94 %-98 %] 97 % (01/20 1054) Weight:  [69.491 kg (153 lb 3.2 oz)] 69.491 kg (153 lb 3.2 oz) (01/20 0605) 2 liters   PHYSICAL EXAMINATION: General: no distress Neuro:  Alert, normal strength HEENT:  Decreased hearing, decreased visual acuity, no sinus tenderness, no oral exudate, wheeze over neck Cardiovascular: regular, no murmur Lungs:  B/l expiratory wheezing improved. Has some pursed lip breathing w/ exertion  Abdomen:  Soft, non tender, + bowel sounds Musculoskeletal:  Ankle edema Skin:  No rashes  Labs: CBC Recent Labs     06/06/13  1720  WBC  16.2*  HGB  11.7*  HCT  34.6*  PLT  185   BMET Recent Labs     06/05/13  0500  06/06/13  0500  06/07/13  0517  NA  130*  135*  134*  K  5.2  4.8  4.9  CL  90*  94*  95*  CO2  31  34*  34*  BUN  26*  26*  21  CREATININE  0.79  0.80  0.73  GLUCOSE  154*  110*  91    Electrolytes Recent Labs     06/05/13  0500  06/06/13  0500  06/07/13  0517   CALCIUM  8.5  8.4  8.4   Imaging Dg Chest 2 View  06/07/2013   CLINICAL DATA:  COPD exacerbation.  EXAM: CHEST  2 VIEW  COMPARISON:  PA and lateral chest 05/04/2012 and CT PA and lateral chest 05/04/2012. Portable chest 06/03/2013.  FINDINGS: Changes of COPD are again seen. There is no consolidative process, pneumothorax or effusion. Heart size is upper normal.  IMPRESSION: COPD without acute disease.   Electronically Signed   By: Drusilla Kannerhomas  Dalessio M.D.   On: 06/07/2013 10:29      ASSESSMENT / PLAN:  Acute on chronic hypoxic/hypercapnic respiratory failure in setting of AECOPD P: -continue pulmicort/brovana, spiriva with prn xopenex  -wean off steroids as tolerated -Abx per primary team -augment bronchial hygiene regimen -oxygen to keep SpO2 > 90%  Upper airway cough syndrome. P: -continue flonase, nasal irrigation -agree with addition of BID protonix for now  Hx of HTN with acute on chronic diastolic dysfx. P: -per primary team  Tachycardia. P: -per primary team  Goals of care >> DNR/DNI  Seems better. Should slowly improve. Upper airway irritation typically is a little slower to resolve than typical COPD flare   Reviewed above, examined pt, and agree with assessment/plan.  Pulmonary plan in place.  PCCM will sign off.  Please call if additional help needed while pt is in hospital.  Coralyn Helling, MD [facility] Pulmonary/Critical Care 06/07/2013, 3:08 PM Pager:  [phone] After 3pm call: [phone]

## 2013-06-07 NOTE — Progress Notes (Signed)
Nursing reports pt is catholic and pt's daughter is requesting visit from [employer].   Chaplain will place pt on catholic list for parish priest.  Will follow up with pt and family in AM to assess needs and determine whether other support necessary.   Belva CromeStalnaker, Tirrell Buchberger Wayne MDiv

## 2013-06-07 NOTE — Progress Notes (Signed)
[name] PROGRESS NOTE  Tonya [name] RUE:[id number] DOB: [birth year] DOA: 06/03/2013 PCP: Nadean Corwin, MD  Brief summary  Tonya [name] is a 80 y.o. female with Past medical history of COPD, hypertension.  The patient is coming from ALF. The patient is presenting with complaints of cough and shortness of breath that started earlier this morning. She mentions that she had some hacking cough yesterday and today and as the day progressed she started having some shortness of breath with the excessive sputum production which is greenish yellow in color without any blood. Also having some fever and chills and in the afternoon as well as in the evening the breathing got worse and therefore she called 911.    Tonya [name] was started on tx for acute COPD with minimal improvement.  She has become increasingly fatigued and frustrated.  Pulmonary was consulted and has assisted, however, patient states she wants to be more comfortable and she wants to take as few medications as possible.  Talked about palliative care consult for goals of care and further assistance with symptom management.    Assessment/Plan  Acute on chronic hypoxic respiratory failure likely secondary to acute COPD exacerbation.  Persistent increased work of breathing and patient is "at her wit's end" -  Appreciate Pulm recommendations -  Continue prednisone -  Continue pulmicort BID -  Continue brovana -  Continue q6h ipra + xopenex -  Patient declines spiriva -  S/p levofloxacin x 3 days -  Change to once daily PPI -  Speech therapy:  Intermittent aspiration.  Recommended regular with thin liquids and strategies to reduce aspiration risk.  Possible acute diastolic heart failure, proBNP:  331.  Was somewhat pre-renal but resolving -  Change to once daily diuretics  Mild dehydration, resolved with IVF   MAT and premature atrial beats, likely secondary to respirator distress and bronchodilators and  transitioned to NSR on telemetry.  Telemetry now discontinued.    Hypertension, blood pressure elevated, possibly due to steroids, however, would tolerate 140s-150s in this 80 yo female.  She wants to take as few medications as possible.   -  Allergy to CCB -  Avoid hydralazine 2/2 recent MAT -  D/c bisoprolol and trend BP -  Continue prn metoprolol for SBP > 160  Normocytic anemia, hemoglobin trending down slightly.  -  Repeat CBC in AM  Thrombocytopenia, likely acute phase reactant and resolved  Leukocytosis, likely secondary to steroids, resolving  Moderate protein-calorie malnutrition, eating well now per family  -  Start ensure -  Diet regular  Anxiety, severe -  Increase clonazepam to 0.5mg  qAM and 1mg  qPM -  Continue prn xanax  Diet:  Regular Access:  PIV IVF:  Off Proph:  Lovenox  Code Status: DO NOT RESUSCITATE Family Communication: Patient alone Disposition Plan: Pending improvement in respiratory distress, PT/OT recommending SNF.  Consult placed   Consultants:  None  Procedures:  Chest x-ray  Antibiotics:  Levofloxacin from 1/16 - 1/18  HPI/Subjective:  Still anxious and SOB.  Hasn't slept.    Objective: Filed Vitals:   06/07/13 0759 06/07/13 1054 06/07/13 1400 06/07/13 2032  BP:   154/69   Pulse:   71   Temp:   97.9 F (36.6 C)   TempSrc:   Oral   Resp:   22   Height:      Weight:      SpO2: 97% 97% 96% 98%    Intake/Output Summary (Last 24 hours) at 06/07/13 2036  Last data filed at 06/07/13 1922  Gross per 24 hour  Intake    183 ml  Output   2150 ml  Net  -1967 ml   Filed Weights   06/05/13 0700 06/06/13 0223 06/07/13 0605  Weight: 69.2 kg (152 lb 8.9 oz) 67.813 kg (149 lb 8 oz) 69.491 kg (153 lb 3.2 oz)    Exam:   General:  [name], moderate respiratory distress with SCM, intercostal, subcostal, and supraclavicular retractions, perioral cyanosis after walking with oxygen back from bathroom and very fatigued.  Took 10  minutes to recover  HEENT:  NCAT, MMM  Cardiovascular:  RRR, nl S1, S2 no mrg, 2+ pulses, warm extremities  Respiratory:  Rales at the bilateral bases, Full expiratory wheeze and diminished at the bases, positive rhonchi, + increased WOB  Abdomen:   NABS, soft, NT/ND  MSK:   Normal tone and bulk, no LEE  Neuro:  Grossly intact  Data Reviewed: Basic Metabolic Panel:  Recent Labs Lab 06/03/13 0528 06/04/13 0615 06/05/13 0500 06/06/13 0500 06/07/13 0517  NA 137 133* 130* 135* 134*  K 3.8 4.8 5.2 4.8 4.9  CL 96 94* 90* 94* 95*  CO2 29 30 31  34* 34*  GLUCOSE 189* 129* 154* 110* 91  BUN 13 26* 26* 26* 21  CREATININE 0.75 0.85 0.79 0.80 0.73  CALCIUM 8.9 8.7 8.5 8.4 8.4   Liver Function Tests:  Recent Labs Lab 06/03/13 0147 06/03/13 0528  AST 34 21  ALT 23 20  ALKPHOS 62 58  BILITOT 0.4 0.4  PROT 6.9 6.2  ALBUMIN 3.7 3.2*   No results found for this basename: LIPASE, AMYLASE,  in the last 168 hours No results found for this basename: AMMONIA,  in the last 168 hours CBC:  Recent Labs Lab 06/03/13 0147 06/03/13 0528 06/04/13 0615 06/06/13 1720  WBC 11.3* 10.2 14.8* 16.2*  NEUTROABS 9.5*  --   --   --   HGB 11.7* 10.2* 9.7* 11.7*  HCT 34.4* 30.6* 29.2* 34.6*  MCV 93.7 94.7 93.9 94.0  PLT 148* 121* 121* 185   Cardiac Enzymes:  Recent Labs Lab 06/03/13 0147  TROPONINI <0.30   BNP (last 3 results)  Recent Labs  06/03/13 0147  PROBNP 331.7   CBG: No results found for this basename: GLUCAP,  in the last 168 hours  Recent Results (from the past 240 hour(s))  MRSA PCR SCREENING     Status: None   Collection Time    06/03/13  5:28 AM      Result Value Range Status   MRSA by PCR NEGATIVE  NEGATIVE Final   Comment:            The GeneXpert MRSA Assay (FDA     approved for NASAL specimens     only), is one component of a     comprehensive MRSA colonization     surveillance program. It is not     intended to diagnose MRSA     infection nor to  guide or     monitor treatment for     MRSA infections.     Studies: Dg Chest 2 View  06/07/2013   CLINICAL DATA:  COPD exacerbation.  EXAM: CHEST  2 VIEW  COMPARISON:  PA and lateral chest 05/04/2012 and CT PA and lateral chest 05/04/2012. Portable chest 06/03/2013.  FINDINGS: Changes of COPD are again seen. There is no consolidative process, pneumothorax or effusion. Heart size is upper normal.  IMPRESSION: COPD without  acute disease.   Electronically Signed   By: Drusilla Kannerhomas  Dalessio M.D.   On: 06/07/2013 10:29    Scheduled Meds: . arformoterol  15 mcg Nebulization BID  . aspirin EC  81 mg Oral Daily  . budesonide (PULMICORT) nebulizer solution  0.5 mg Nebulization BID  . clonazePAM  0.5 mg Oral Daily  . clonazePAM  1 mg Oral QHS  . docusate sodium  200 mg Oral BID  . enoxaparin (LOVENOX) injection  40 mg Subcutaneous Q24H  . feeding supplement (ENSURE COMPLETE)  237 mL Oral BID BM  . fluticasone  2 spray Each Nare Daily  . [START ON 06/08/2013] furosemide  60 mg Oral Daily  . ipratropium  0.5 mg Nebulization Q4H while awake  . latanoprost  1 drop Right Eye QHS  . [START ON 06/08/2013] pantoprazole  40 mg Oral Daily  . predniSONE  60 mg Oral Q breakfast  . senna  2 tablet Oral QHS  . sodium chloride  1 spray Each Nare BID  . sodium chloride  3 mL Intravenous Q12H  . white petrolatum   Topical BID   Continuous Infusions:   Principal Problem:   COPD exacerbation Active Problems:   HYPERTENSION   Atrial fibrillation   Normocytic anemia   Thrombocytopenia, unspecified   Hyponatremia    Time spent: 30 min    Tonya [name], [facility]ACKENZIE  [name] Pager [phone]. If 7PM-7AM, please contact night-coverage at [email or web address], password [email or web address] 06/07/2013, 8:36 PM  LOS: 4 days

## 2013-06-07 NOTE — Progress Notes (Signed)
RT Note: RT was called to patients room by patients RN to administer a breathing treatment of Xopenex. Patient stated that she did not like to take Spiriva because it makes blood come up into her throat and then from her ear. Rt told patient that the medication being administered was Xopenex. Mid ways through her breathing treatment patient stated that she was feeling burning in the back of her head so the treatment was then stopped. Patients Rn, Esmond CamperKenitrish was notified and is going to page the MD to notify him. Patient is in no apparent respiratory distress currently. Rt will continue to monitor.

## 2013-06-08 DIAGNOSIS — G252 Other specified forms of tremor: Secondary | ICD-10-CM

## 2013-06-08 DIAGNOSIS — G250 Essential tremor: Secondary | ICD-10-CM

## 2013-06-08 DIAGNOSIS — Z515 Encounter for palliative care: Secondary | ICD-10-CM

## 2013-06-08 DIAGNOSIS — I4892 Unspecified atrial flutter: Secondary | ICD-10-CM

## 2013-06-08 DIAGNOSIS — J449 Chronic obstructive pulmonary disease, unspecified: Secondary | ICD-10-CM

## 2013-06-08 LAB — CBC
HCT: 30.5 % — ABNORMAL LOW (ref 36.0–46.0)
Hemoglobin: 10.3 g/dL — ABNORMAL LOW (ref 12.0–15.0)
MCH: 32 pg (ref 26.0–34.0)
MCHC: 33.8 g/dL (ref 30.0–36.0)
MCV: 94.7 fL (ref 78.0–100.0)
Platelets: 150 10*3/uL (ref 150–400)
RBC: 3.22 MIL/uL — AB (ref 3.87–5.11)
RDW: 12.7 % (ref 11.5–15.5)
WBC: 9.9 10*3/uL (ref 4.0–10.5)

## 2013-06-08 LAB — BASIC METABOLIC PANEL
BUN: 25 mg/dL — ABNORMAL HIGH (ref 6–23)
CO2: 37 mEq/L — ABNORMAL HIGH (ref 19–32)
Calcium: 8.7 mg/dL (ref 8.4–10.5)
Chloride: 91 mEq/L — ABNORMAL LOW (ref 96–112)
Creatinine, Ser: 0.91 mg/dL (ref 0.50–1.10)
GFR calc Af Amer: 63 mL/min — ABNORMAL LOW (ref >90)
GFR, EST NON AFRICAN AMERICAN: 55 mL/min — AB (ref >90)
Glucose, Bld: 87 mg/dL (ref 70–99)
Potassium: 4.8 mEq/L (ref 3.7–5.3)
SODIUM: 134 meq/L — AB (ref 137–147)

## 2013-06-08 MED ORDER — MORPHINE SULFATE 15 MG PO TABS
15.0000 mg | ORAL_TABLET | ORAL | Status: DC | PRN
  Administered 2013-06-08: 15 mg via ORAL
  Filled 2013-06-08: qty 1

## 2013-06-08 MED ORDER — IPRATROPIUM BROMIDE 0.02 % IN SOLN
0.5000 mg | Freq: Two times a day (BID) | RESPIRATORY_TRACT | Status: DC
  Administered 2013-06-08 – 2013-06-10 (×4): 0.5 mg via RESPIRATORY_TRACT
  Filled 2013-06-08 (×4): qty 2.5

## 2013-06-08 NOTE — Progress Notes (Signed)
PT Cancellation Note  Patient Details Name: Tonya [name] MRN: [id number] DOB: Mar 02, 1923   Cancelled Treatment:    Reason Eval/Treat Not Completed: Patient declined, no reason specified. Also pt/family about to have palliative care meeting. Will check back tomorrow.    Rebeca AlertJannie Theordore Cisnero, MPT Pager: [phone]

## 2013-06-08 NOTE — Consult Note (Signed)
Patient Tonya [name]      DOB: Mar 10, 1923      YKD:[id number]     Consult Note from the Palliative Medicine Team at [facility] Requested by: Dr. Debara Pickett      PCP: Alesia Richards, MD Reason for Consultation: [place]    Phone Number:[phone]  Assessment of patients Current state: 80 yr old with advanced end stage COPD, readmitted with aflutter, and respiratory failure.  Met with patient and daughter.  She my note from date of service for full details. Patient unable to fully participate in goals.  Helped her understand her prognosis.  She is not against using hospice but just can't commit.  Plan for further goals of care in am while we look into optimizing symptom management with nebs , antianxiety medications,and morphine   Goals of Care: 1.  Code Status: DNR   2. Scope of Treatment: Maximize comfort treatments, continue to try to help patient work through goals deferring to daughter.  Daughter ok with looking into hospice home placement.   4. Disposition: Hospice home placement when medically stable   3. Symptom Management:   1. Anxiety/Agitation: agree with continued klonipin 2. Pain:/dypnea: agree with roxanol / morphine use 3. Aggressive pulmonary toilet, add flutter valve.  4. Psychosocial:  Patient has lived independently until recently.  Daughter trying to let her make her own decisions as they are not always seeing eye to eye.  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  5. Spiritual:  Offered chaplain services.        Patient Documents Completed or Given: Document Given Completed  Advanced Directives Pkt    MOST    DNR    Gone from My Sight    Hard Choices      Brief HPI: 80 yr old white female with fairly advanced COPD, readmitted after treatment for CoPD exacerbation with a flutter 2:1 block. We were asked to assist with Goals of care   ROS: tired, short of breath    PMH:  Past  Medical History  Diagnosis Date  . Emphysema   . Hypertension   . Pneumonia      PSH: Past Surgical History  Procedure Laterality Date  . Abdominal hysterectomy    . Hernia repair      inguinal  . Elbow surgery     I have reviewed the FH and SH and  If appropriate update it with new information. Allergies  Allergen Reactions  . Ciprofloxacin     REACTION: nausea  . Erythromycin     REACTION: rash  . Penicillins     REACTION: nausea  . Verapamil     REACTION: nausea   Scheduled Meds: . arformoterol  15 mcg Nebulization BID  . aspirin EC  81 mg Oral Daily  . budesonide (PULMICORT) nebulizer solution  0.5 mg Nebulization BID  . clonazePAM  0.5 mg Oral Daily  . clonazePAM  1 mg Oral QHS  . docusate sodium  200 mg Oral BID  . enoxaparin (LOVENOX) injection  40 mg Subcutaneous Q24H  . feeding supplement (ENSURE COMPLETE)  237 mL Oral BID BM  . fluticasone  2 spray Each Nare Daily  . furosemide  60 mg Oral Daily  . ipratropium  0.5 mg Nebulization BID  . latanoprost  1 drop Right Eye QHS  . pantoprazole  40 mg Oral Daily  . predniSONE  60 mg Oral Q breakfast  . senna  2 tablet Oral QHS  . sodium chloride  1 spray Each Nare BID  . sodium chloride  3 mL Intravenous Q12H  . white petrolatum   Topical BID   Continuous Infusions:  PRN Meds:.acetaminophen, acetaminophen, ALPRAZolam, Glycerin (Adult), levalbuterol, magnesium hydroxide, metoprolol, morphine, simethicone    BP 95/59  Pulse 72  Temp(Src) 98.5 F (36.9 C) (Oral)  Resp 14  Ht _0  (1.651 m)  Wt 68.584 kg (151 lb 3.2 oz)  BMI 25.16 kg/m2  SpO2 96%   PPS:30-40%   Intake/Output Summary (Last 24 hours) at 06/08/13 2327 Last data filed at 06/08/13 2230  Gross per 24 hour  Intake    240 ml  Output   3300 ml  Net  -3060 ml     Physical Exam:  General: using accessory muscles, purse lipped HEENT:  PERRL, EOMI, Chest:   Course baslar rhonchi, and wheezing , very little air movement CVS: irregular,  S1, S2 Abdomen: soft, not tender or distended Ext: warm, multiple areas of ecchymosis Neuro:hard of hearing, oriented to self and daughter, slightly confused  Labs: CBC    Component Value Date/Time   WBC 9.9 06/08/2013 1659   RBC 3.22* 06/08/2013 1659   HGB 10.3* 06/08/2013 1659   HCT 30.5* 06/08/2013 1659   PLT 150 06/08/2013 1659   MCV 94.7 06/08/2013 1659   MCH 32.0 06/08/2013 1659   MCHC 33.8 06/08/2013 1659   RDW 12.7 06/08/2013 1659   LYMPHSABS 0.9 06/03/2013 0147   MONOABS 0.8 06/03/2013 0147   EOSABS 0.1 06/03/2013 0147   BASOSABS 0.0 06/03/2013 0147       CMP     Component Value Date/Time   NA 134* 06/08/2013 0543   K 4.8 06/08/2013 0543   CL 91* 06/08/2013 0543   CO2 37* 06/08/2013 0543   GLUCOSE 87 06/08/2013 0543   BUN 25* 06/08/2013 0543   CREATININE 0.91 06/08/2013 0543   CALCIUM 8.7 06/08/2013 0543   PROT 6.2 06/03/2013 0528   ALBUMIN 3.2* 06/03/2013 0528   AST 21 06/03/2013 0528   ALT 20 06/03/2013 0528   ALKPHOS 58 06/03/2013 0528   BILITOT 0.4 06/03/2013 0528   GFRNONAA 55* 06/08/2013 0543   GFRAA 63* 06/08/2013 0543    Chest Xray Reviewed/Impressions: COPD without acute disease      Time In Time Out Total Time Spent with Patient Total Overall Time  310 pm 500 pm 110 min 110 min    Greater than 50%  of this time was spent counseling and coordinating care related to the above assessment and plan.  Gentle Hoge L. Lovena Le, MD MBA The Palliative Medicine Team at [facility] Phone: [phone] Pager: [phone]

## 2013-06-08 NOTE — Progress Notes (Signed)
[name] PROGRESS NOTE  Tonya [name] ZOX:[id number] DOB: Aug 25, 1922 DOA: 06/03/2013 PCP: Nadean Corwin, MD  Assessment/Plan  Acute on chronic hypoxic respiratory failure likely secondary to acute COPD exacerbation.  Persistent increased work of breathing -  Appreciate Pulm recommendations -  Continue prednisone -  Continue pulmicort BID -  Continue brovana -  Continue q6h ipra + xopenex -  Patient declined spiriva -  S/p levofloxacin x 3 days -  Change to once daily PPI -  Speech therapy:  Intermittent aspiration.  Recommended regular with thin liquids and strategies to reduce aspiration risk.  Possible acute diastolic heart failure, proBNP:  331.  Was somewhat pre-renal but resolving -  Changed to once daily diuretics  Mild dehydration, resolved with IVF   MAT and premature atrial beats, likely secondary to respirator distress and bronchodilators and transitioned to NSR on telemetry.  Telemetry now discontinued.    Hypertension, blood pressure elevated, possibly due to steroids, however, would tolerate 140s-150s in this 80 yo female.  She wants to take as few medications as possible.   -  Allergy to CCB -  Avoid hydralazine 2/2 recent MAT -  D/c bisoprolol and trend BP -  Continue prn metoprolol for SBP > 160  Normocytic anemia, hemoglobin trending down slightly.  -  Repeat CBC in AM  Thrombocytopenia, likely acute phase reactant and resolved  Leukocytosis, likely secondary to steroids, resolving  Moderate protein-calorie malnutrition, eating well now per family  -  Start ensure -  Diet regular  Anxiety, severe -  Increase clonazepam to 0.5mg  qAM and 1mg  qPM -  Continue prn xanax  Diet:  Regular Access:  PIV IVF:  Off Proph:  Lovenox  Code Status: DO NOT RESUSCITATE Family Communication: Patient alone Disposition Plan: Pending improvement in respiratory distress, PT/OT recommending SNF.  Consult  placed   Consultants:  Pulmonary  Palliative care  Procedures:  Chest x-ray  Antibiotics:  Levofloxacin from 1/16 - 1/18  HPI/Subjective: No acute events noted overnight  Objective: Filed Vitals:   06/07/13 1400 06/07/13 2032 06/07/13 2205 06/08/13 0605  BP: 154/69  100/49 138/64  Pulse: 71  69 117  Temp: 97.9 F (36.6 C)  98.6 F (37 C) 98.5 F (36.9 C)  TempSrc: Oral  Oral Oral  Resp: 22  14 18   Height:      Weight:      SpO2: 96% 98% 99% 84%    Intake/Output Summary (Last 24 hours) at 06/08/13 0943 Last data filed at 06/08/13 8119  Gross per 24 hour  Intake    420 ml  Output   1450 ml  Net  -1030 ml   Filed Weights   06/05/13 0700 06/06/13 0223 06/07/13 0605  Weight: 69.2 kg (152 lb 8.9 oz) 67.813 kg (149 lb 8 oz) 69.491 kg (153 lb 3.2 oz)    Exam:   General:  [name], moderate respiratory distress with SCM, intercostal, subcostal, and supraclavicular retractions, perioral cyanosis after walking with oxygen back from bathroom and very fatigued.  Took 10 minutes to recover  HEENT:  NCAT, MMM  Cardiovascular:  RRR, nl S1, S2 no mrg, 2+ pulses, warm extremities  Respiratory:  Rales at the bilateral bases, Full expiratory wheeze and diminished at the bases, positive rhonchi, + increased WOB  Abdomen:   NABS, soft, NT/ND  MSK:   Normal tone and bulk, no LEE  Neuro:  Grossly intact  Data Reviewed: Basic Metabolic Panel:  Recent Labs Lab 06/04/13 0615 06/05/13 0500 06/06/13  0500 06/07/13 0517 06/08/13 0543  NA 133* 130* 135* 134* 134*  K 4.8 5.2 4.8 4.9 4.8  CL 94* 90* 94* 95* 91*  CO2 30 31 34* 34* 37*  GLUCOSE 129* 154* 110* 91 87  BUN 26* 26* 26* 21 25*  CREATININE 0.85 0.79 0.80 0.73 0.91  CALCIUM 8.7 8.5 8.4 8.4 8.7   Liver Function Tests:  Recent Labs Lab 06/03/13 0147 06/03/13 0528  AST 34 21  ALT 23 20  ALKPHOS 62 58  BILITOT 0.4 0.4  PROT 6.9 6.2  ALBUMIN 3.7 3.2*   No results found for this basename:  LIPASE, AMYLASE,  in the last 168 hours No results found for this basename: AMMONIA,  in the last 168 hours CBC:  Recent Labs Lab 06/03/13 0147 06/03/13 0528 06/04/13 0615 06/06/13 1720  WBC 11.3* 10.2 14.8* 16.2*  NEUTROABS 9.5*  --   --   --   HGB 11.7* 10.2* 9.7* 11.7*  HCT 34.4* 30.6* 29.2* 34.6*  MCV 93.7 94.7 93.9 94.0  PLT 148* 121* 121* 185   Cardiac Enzymes:  Recent Labs Lab 06/03/13 0147  TROPONINI <0.30   BNP (last 3 results)  Recent Labs  06/03/13 0147  PROBNP 331.7   CBG: No results found for this basename: GLUCAP,  in the last 168 hours  Recent Results (from the past 240 hour(s))  MRSA PCR SCREENING     Status: None   Collection Time    06/03/13  5:28 AM      Result Value Range Status   MRSA by PCR NEGATIVE  NEGATIVE Final   Comment:            The GeneXpert MRSA Assay (FDA     approved for NASAL specimens     only), is one component of a     comprehensive MRSA colonization     surveillance program. It is not     intended to diagnose MRSA     infection nor to guide or     monitor treatment for     MRSA infections.     Studies: Dg Chest 2 View  06/07/2013   CLINICAL DATA:  COPD exacerbation.  EXAM: CHEST  2 VIEW  COMPARISON:  PA and lateral chest 05/04/2012 and CT PA and lateral chest 05/04/2012. Portable chest 06/03/2013.  FINDINGS: Changes of COPD are again seen. There is no consolidative process, pneumothorax or effusion. Heart size is upper normal.  IMPRESSION: COPD without acute disease.   Electronically Signed   By: Drusilla Kanner M.D.   On: 06/07/2013 10:29    Scheduled Meds: . arformoterol  15 mcg Nebulization BID  . aspirin EC  81 mg Oral Daily  . budesonide (PULMICORT) nebulizer solution  0.5 mg Nebulization BID  . clonazePAM  0.5 mg Oral Daily  . clonazePAM  1 mg Oral QHS  . docusate sodium  200 mg Oral BID  . enoxaparin (LOVENOX) injection  40 mg Subcutaneous Q24H  . feeding supplement (ENSURE COMPLETE)  237 mL Oral BID BM   . fluticasone  2 spray Each Nare Daily  . furosemide  60 mg Oral Daily  . ipratropium  0.5 mg Nebulization Q4H while awake  . latanoprost  1 drop Right Eye QHS  . pantoprazole  40 mg Oral Daily  . predniSONE  60 mg Oral Q breakfast  . senna  2 tablet Oral QHS  . sodium chloride  1 spray Each Nare BID  . sodium chloride  3 mL  Intravenous Q12H  . white petrolatum   Topical BID   Continuous Infusions:   Principal Problem:   COPD exacerbation Active Problems:   HYPERTENSION   Atrial fibrillation   Normocytic anemia   Thrombocytopenia, unspecified   Hyponatremia    Time spent: 30 min    Nneoma Harral K  [name] Pager [phone]. If 7PM-7AM, please contact night-coverage at [email or web address], password [email or web address] 06/08/2013, 9:43 AM  LOS: 5 days

## 2013-06-08 NOTE — Progress Notes (Signed)
Phone conversation with [facility] Hulan FrayX.  Deacon will visit pt and family for communion today and recommend to Msgr. If pt wishes to have anointing.   Will follow for further assessment of needs and support.   Belva CromeStalnaker, Jerzi Tigert Wayne MDiv

## 2013-06-08 NOTE — Consult Note (Signed)
Patient Tonya [name]      DOB: 07/16/1922      UGQ:[id number]  Summary of Goals of Care; full note to follow:  Met with patient, daughter Mickel Baas , and Niece Vaughan Basta  Patient struggling to participate due to worsening hearing, and respiratory distress/  Arrived to find patient in distress requesting "symbicort".  Called respiratory and xopenex treatment was given.  Slow but some relief , added her flonase and ultimately her saline.  Patient admits she feels "lousy".  Daughter reports decline over last 1 year with regard to breathing.  Patient had lived in independent living with progressively increasing assistance .  She has lost her ability to drive related to loss of vision and her breathing has become progressively worse.  Today the patient tells me that the morphine may have helped some but that when she was offered morphine that she was sick enough to need hospice and die.  I gently affirmed with her that she is sick enough to die.  She reported to me that she thought that hospice might be helpful and was willing to consider this.  She became fatigued during the converstaion and needed to use the rest room.  We, therefore agreed to continue our goals in the am with the understanding that we would talk about location of care and more about hospice.  She was satisfied with this plan. I spoke further with her daughter and opened up the options for possible consideration for Residential placement.  She would like to consider this as the best option for discharge.  We will discuss further in the am.    Recommend:  1.  DNR affirmed  2.  Respiratory failure: continue aggressive pulmonary toilet.  Add flutter valve. Continue antibiotics, nebs and oxygen   3.  Dyspnea:  Agree with prn ativan in addition to her clonazepam,  And prn morphine.   Prognosis: likely less than 6 months, may actually be in the days to week phase if lungs do not respond to treatments.  Will work on disposition in the  am.  Total time: 310 pm-500 pm   Javyn Havlin L. Lovena Le, MD MBA The Palliative Medicine Team at [facility] Phone: [phone] Pager: [phone]

## 2013-06-09 LAB — BASIC METABOLIC PANEL
BUN: 26 mg/dL — ABNORMAL HIGH (ref 6–23)
CALCIUM: 8.8 mg/dL (ref 8.4–10.5)
CO2: 38 meq/L — AB (ref 19–32)
Chloride: 91 mEq/L — ABNORMAL LOW (ref 96–112)
Creatinine, Ser: 0.97 mg/dL (ref 0.50–1.10)
GFR calc Af Amer: 59 mL/min — ABNORMAL LOW (ref >90)
GFR calc non Af Amer: 51 mL/min — ABNORMAL LOW (ref >90)
Glucose, Bld: 103 mg/dL — ABNORMAL HIGH (ref 70–99)
Potassium: 4.2 mEq/L (ref 3.7–5.3)
SODIUM: 136 meq/L — AB (ref 137–147)

## 2013-06-09 NOTE — Progress Notes (Signed)
CSW assisting with d/c planning. PN reviewed. Will be available to meet with pt / family following Palliative Care follow up this am to continue assisting d/c planning.  Cori RazorJamie Cordelro Gautreau LCSW [phone]

## 2013-06-09 NOTE — Progress Notes (Signed)
CSW assisting with d/c planning. Spoke with pt's daughter this am. She gave CSW permission to contact [facility] for possible placement,. Unfortunately [facility] has no availability at this time. Will meet with daughter again to see if there may be another hospice home to consider.  Cori RazorJamie Drevon Plog LCSW [phone]

## 2013-06-09 NOTE — Progress Notes (Signed)
CSW assisting with d/c planning. Pt is on waiting list for [facility]. CSW will contact hospice in the am to see if there is availability. Family is in agreement with SNF placement until hospice home is available. Family has accepted SNF bed at [facility] with palliative care services. SNF bed will be available tomorrow. CSW will continue to follow to assist with d/c planning.  Cori RazorJamie Lehman Whiteley LCSW [phone]

## 2013-06-09 NOTE — Progress Notes (Signed)
OT Cancellation Note  Patient Details Name: Tonya [name] MRN: [id number] DOB: [birth year]   Cancelled Treatment:    Reason Eval/Treat Not Completed: Other (comment) Note plan for GOC meeting. Will followup after.  Lennox LaityStone, Novah Nessel Stafford [phone] 06/09/2013, 11:40 AM

## 2013-06-09 NOTE — Progress Notes (Signed)
PT Cancellation Note  Patient Details Name: Gerilyn NestleKathleen W Leary MRN: [id number] DOB: 1923-02-19   Cancelled Treatment:    Reason Eval/Treat Not Completed: Medical issues which prohibited therapy (plans for GOC mtg today. will f/u afterwards.)   Rada HayHill, Kanyla Omeara Elizabeth 06/09/2013, 8:07 AM Blanchard KelchKaren Hodan Wurtz PT [phone]

## 2013-06-09 NOTE — Progress Notes (Signed)
Patient XL:[id number]:Tonya [name]      DOB: July 19, 1922      OZD:[id number]   [facility] Progress Note    Subjective: Attempted to converse with Tonya [name] regarding further goals of care. She has retained from her last visit that her pulmonary status will not improve. She is giving us permission to find the best location of care for her. She otherwise does not open up. There are times where she does appear somewhat confused. I reviewed the case with the patient's daughter and we will attempt to acquire sniff placement with palliative care services versus hospice to follow    Filed Vitals:   06/09/13 1400  BP: 125/57  Pulse: 81  Temp: 97.4 F (36.3 C)  Resp: 18   Physical exam:  Gen. awake alert oriented but sometimes tangential in her thoughts with memory loss, some of this is also due to hearing difficulties. Pupils equal round reactive to light but she has decreased vision and decreased hearing Chest is decreased with slightly less wheezing than yesterday but she does have some coarse crackles bilaterally Cardiovascular regular rate and rhythm positive S1 and S2 no S3-S4 appreciate a murmur gallop Abdomen soft nontender nondistended with positive bowel sounds Stimulation multiple areas of ecchymosis decreased strength and edema in her lower extremities Neurologically she has decreased hearing decreased vision she is awake alert and generally oriented which he can hear question   Assessment and plan: 80 year old white female with advanced COPD, mild dementia, and hypertension. She presented to the emergency room with a COPD exacerbation and was found to be in a flutter 2-1 block. She was treated with diltiazem and other medications in currently has better control of her rate. It appears that her pulmonary status is not going to improve and has progressed on to hospice level disease. Patient is permitting us to place her in skilled nursing facility with  consideration for hospice to help care for her.   1. DO NOT RESUSCITATE DO NOT INTUBATE  2. Respiratory distress continue aggressive pulmonary toilet. The patient is being given steroids if she refuses she does not have to be forced to take them as she feels that make surgery an anxious. Continue flutter valve and antibiotics. 3. Dyspnea continue as needed benzodiazepines and morphine.   Total time 40 minutes: 1:30 PM to 2:10 PM   Olman Yono L. Ladona Ridgelaylor, MD MBA The Palliative Medicine Team at [facility] Team Phone: [phone] Pager: [phone]

## 2013-06-09 NOTE — Progress Notes (Signed)
[name] PROGRESS NOTE  Tonya [name] ZOX:[id number] DOB: 07-13-1922 DOA: 06/03/2013 PCP: Nadean Corwin, MD  Assessment/Plan  Acute on chronic hypoxic respiratory failure likely secondary to acute COPD exacerbation.  Persistent increased work of breathing -  Appreciate Pulm recommendations -  Continue prednisone as tolerated - pt has been refusing recently -  Continue pulmicort BID -  Continue brovana -  Continue q6h ipra + xopenex -  Patient declined spiriva -  S/p levofloxacin x 3 days - pt refused further abx -  Change to once daily PPI -  Speech therapy:  Intermittent aspiration.  Recommended regular with thin liquids and strategies to reduce aspiration risk.  Possible acute diastolic heart failure, proBNP:  331.   -  Changed to once daily diuretics  Mild dehydration, resolved with IVF   MAT and premature atrial beats, likely secondary to respirator distress and bronchodilators and transitioned to NSR on telemetry.  Telemetry now discontinued.    Hypertension, blood pressure elevated, possibly due to steroids, however, would tolerate 140s-150s in this 79 yo female.  She wants to take as few medications as possible.   -  Allergy to CCB -  Avoid hydralazine 2/2 recent MAT -  D/c bisoprolol and trend BP -  Continue prn metoprolol for SBP > 160  Normocytic anemia, hemoglobin trending down slightly.   Thrombocytopenia, likely acute phase reactant and resolved  Leukocytosis, likely secondary to steroids, resolving  Moderate protein-calorie malnutrition, eating well now per family  -  On ensure -  Diet regular as tolerated  Anxiety, severe -  Increase clonazepam to 0.5mg  qAM and 1mg  qPM -  Continue prn xanax  Diet:  Regular Access:  PIV IVF:  Off Proph:  Lovenox  Code Status: DO NOT RESUSCITATE Family Communication: Patient alone Disposition Plan: Pending improvement in respiratory distress, PT/OT recommending SNF.  Pending placement  now   Consultants:  Pulmonary  Palliative care  Procedures:  Chest x-ray  Antibiotics:  Levofloxacin from 1/16 - 1/18  HPI/Subjective: No acute events noted overnight  Objective: Filed Vitals:   06/08/13 1927 06/08/13 2230 06/09/13 0605 06/09/13 1048  BP:  95/59 178/84   Pulse:  72 89   Temp:  98.5 F (36.9 C) 99.1 F (37.3 C)   TempSrc:  Oral Oral   Resp:  14 12   Height:      Weight:   65 kg (143 lb 4.8 oz)   SpO2: 98% 96% 98% 98%    Intake/Output Summary (Last 24 hours) at 06/09/13 1248 Last data filed at 06/09/13 1042  Gross per 24 hour  Intake    123 ml  Output   2800 ml  Net  -2677 ml   Filed Weights   06/07/13 0605 06/08/13 0900 06/09/13 0605  Weight: 69.491 kg (153 lb 3.2 oz) 68.584 kg (151 lb 3.2 oz) 65 kg (143 lb 4.8 oz)    Exam:   General:  [name], moderate respiratory distress with SCM, intercostal, subcostal, and supraclavicular retractions, perioral cyanosis after walking with oxygen back from bathroom and very fatigued.  Took 10 minutes to recover  HEENT:  NCAT, MMM  Cardiovascular:  RRR, nl S1, S2 no mrg, 2+ pulses, warm extremities  Respiratory:  Rales at the bilateral bases, Full expiratory wheeze and diminished at the bases, positive rhonchi, + increased WOB  Abdomen:   NABS, soft, NT/ND  MSK:   Normal tone and bulk, no LEE  Neuro:  Grossly intact  Data Reviewed: Basic Metabolic Panel:  Recent Labs Lab 06/05/13 0500 06/06/13 0500 06/07/13 0517 06/08/13 0543 06/09/13 0603  NA 130* 135* 134* 134* 136*  K 5.2 4.8 4.9 4.8 4.2  CL 90* 94* 95* 91* 91*  CO2 31 34* 34* 37* 38*  GLUCOSE 154* 110* 91 87 103*  BUN 26* 26* 21 25* 26*  CREATININE 0.79 0.80 0.73 0.91 0.97  CALCIUM 8.5 8.4 8.4 8.7 8.8   Liver Function Tests:  Recent Labs Lab 06/03/13 0147 06/03/13 0528  AST 34 21  ALT 23 20  ALKPHOS 62 58  BILITOT 0.4 0.4  PROT 6.9 6.2  ALBUMIN 3.7 3.2*   No results found for this basename: LIPASE, AMYLASE,   in the last 168 hours No results found for this basename: AMMONIA,  in the last 168 hours CBC:  Recent Labs Lab 06/03/13 0147 06/03/13 0528 06/04/13 0615 06/06/13 1720 06/08/13 1659  WBC 11.3* 10.2 14.8* 16.2* 9.9  NEUTROABS 9.5*  --   --   --   --   HGB 11.7* 10.2* 9.7* 11.7* 10.3*  HCT 34.4* 30.6* 29.2* 34.6* 30.5*  MCV 93.7 94.7 93.9 94.0 94.7  PLT 148* 121* 121* 185 150   Cardiac Enzymes:  Recent Labs Lab 06/03/13 0147  TROPONINI <0.30   BNP (last 3 results)  Recent Labs  06/03/13 0147  PROBNP 331.7   CBG: No results found for this basename: GLUCAP,  in the last 168 hours  Recent Results (from the past 240 hour(s))  MRSA PCR SCREENING     Status: None   Collection Time    06/03/13  5:28 AM      Result Value Range Status   MRSA by PCR NEGATIVE  NEGATIVE Final   Comment:            The GeneXpert MRSA Assay (FDA     approved for NASAL specimens     only), is one component of a     comprehensive MRSA colonization     surveillance program. It is not     intended to diagnose MRSA     infection nor to guide or     monitor treatment for     MRSA infections.     Studies: No results found.  Scheduled Meds: . arformoterol  15 mcg Nebulization BID  . aspirin EC  81 mg Oral Daily  . budesonide (PULMICORT) nebulizer solution  0.5 mg Nebulization BID  . clonazePAM  0.5 mg Oral Daily  . clonazePAM  1 mg Oral QHS  . docusate sodium  200 mg Oral BID  . enoxaparin (LOVENOX) injection  40 mg Subcutaneous Q24H  . feeding supplement (ENSURE COMPLETE)  237 mL Oral BID BM  . fluticasone  2 spray Each Nare Daily  . furosemide  60 mg Oral Daily  . ipratropium  0.5 mg Nebulization BID  . latanoprost  1 drop Right Eye QHS  . pantoprazole  40 mg Oral Daily  . predniSONE  60 mg Oral Q breakfast  . senna  2 tablet Oral QHS  . sodium chloride  1 spray Each Nare BID  . sodium chloride  3 mL Intravenous Q12H  . white petrolatum   Topical BID   Continuous Infusions:    Principal Problem:   COPD exacerbation Active Problems:   HYPERTENSION   Atrial fibrillation   Normocytic anemia   Thrombocytopenia, unspecified   Hyponatremia  Time spent: 30 min  CHIU, STEPHEN K  [name] Pager [phone]. If 7PM-7AM, please contact night-coverage at [email or web address],  password [facility] 06/09/2013, 12:48 PM  LOS: 6 days

## 2013-06-10 LAB — BASIC METABOLIC PANEL
BUN: 17 mg/dL (ref 6–23)
CALCIUM: 8.4 mg/dL (ref 8.4–10.5)
CO2: 39 mEq/L — ABNORMAL HIGH (ref 19–32)
Chloride: 92 mEq/L — ABNORMAL LOW (ref 96–112)
Creatinine, Ser: 0.79 mg/dL (ref 0.50–1.10)
GFR, EST AFRICAN AMERICAN: 84 mL/min — AB (ref >90)
GFR, EST NON AFRICAN AMERICAN: 72 mL/min — AB (ref >90)
Glucose, Bld: 85 mg/dL (ref 70–99)
Potassium: 4 mEq/L (ref 3.7–5.3)
SODIUM: 137 meq/L (ref 137–147)

## 2013-06-10 MED ORDER — CLONAZEPAM 1 MG PO TABS: 1.0000 mg | ORAL_TABLET | Freq: Every day | ORAL | Status: AC

## 2013-06-10 MED ORDER — IPRATROPIUM BROMIDE 0.02 % IN SOLN: 0.5000 mg | Freq: Two times a day (BID) | RESPIRATORY_TRACT | Status: AC

## 2013-06-10 MED ORDER — CLONAZEPAM 0.5 MG PO TABS: 0.5000 mg | ORAL_TABLET | Freq: Every day | ORAL | Status: AC

## 2013-06-10 MED ORDER — BUDESONIDE 0.5 MG/2ML IN SUSP: 0.5000 mg | Freq: Two times a day (BID) | RESPIRATORY_TRACT | Status: AC

## 2013-06-10 MED ORDER — PREDNISONE 20 MG PO TABS: 60.0000 mg | ORAL_TABLET | Freq: Every day | ORAL | Status: DC

## 2013-06-10 MED ORDER — ARFORMOTEROL TARTRATE 15 MCG/2ML IN NEBU: 15.0000 ug | INHALATION_SOLUTION | Freq: Two times a day (BID) | RESPIRATORY_TRACT | Status: AC

## 2013-06-10 MED ORDER — FUROSEMIDE 20 MG PO TABS: 60.0000 mg | ORAL_TABLET | Freq: Every day | ORAL | Status: AC

## 2013-06-10 MED ORDER — FLUTICASONE PROPIONATE 50 MCG/ACT NA SUSP: 2.0000 | Freq: Every day | NASAL | Status: AC

## 2013-06-10 MED ORDER — PANTOPRAZOLE SODIUM 40 MG PO TBEC: 40.0000 mg | DELAYED_RELEASE_TABLET | Freq: Every day | ORAL | Status: AC

## 2013-06-10 MED ORDER — ALPRAZOLAM 1 MG PO TABS: 1.0000 mg | ORAL_TABLET | ORAL | Status: AC | PRN

## 2013-06-10 MED ORDER — DSS 100 MG PO CAPS: 200.0000 mg | ORAL_CAPSULE | Freq: Two times a day (BID) | ORAL | Status: AC

## 2013-06-10 MED ORDER — MORPHINE SULFATE 15 MG PO TABS: 15.0000 mg | ORAL_TABLET | ORAL | Status: DC | PRN

## 2013-06-10 NOTE — Discharge Summary (Addendum)
Physician Discharge Summary  Tonya NestleKathleen W Insalaco ZOX:[id number] DOB: May 23, 1922 DOA: 06/03/2013  PCP: Nadean CorwinMCKEOWN,WILLIAM DAVID, MD  Admit date: 06/03/2013 Discharge date: 06/10/2013  Time spent: 35 minutes  Recommendations for Outpatient Follow-up:  1. Follow up with PCP as needed  Discharge Diagnoses:  Principal Problem:   COPD exacerbation Active Problems:   HYPERTENSION   Atrial fibrillation   Normocytic anemia   Thrombocytopenia, unspecified   Hyponatremia   Discharge Condition: Stable  Diet recommendation: Regular  Filed Weights   06/08/13 0900 06/09/13 0605 06/10/13 0630  Weight: 68.584 kg (151 lb 3.2 oz) 65 kg (143 lb 4.8 oz) 67.813 kg (149 lb 8 oz)    History of present illness:  Tonya [name] is a 80 y.o. female with Past medical history of COPD, hypertension.  The patient is coming from ALF. The patient is presenting with complaints of cough and shortness of breath that started earlier this morning. She mentions that she had some hacking cough yesterday and today and as the day progressed she started having some shortness of breath with the excessive sputum production which is greenish yellow in color without any blood. Also having some fever and chills and in the afternoon as well as in the evening the breathing got worse and therefore she called 911.  Hospital Course:  Acute on chronic hypoxic respiratory failure likely secondary to acute COPD exacerbation. Persistent increased work of breathing  - Appreciate Pulm recommendations  - Continue prednisone as tolerated - pt has been refusing recently  - Continue pulmicort BID  - Continue brovana  - Continue q6h ipra + xopenex  - Patient declined spiriva  - S/p levofloxacin x 3 days - pt refused further abx  - Change to once daily PPI  - Speech therapy: Intermittent aspiration. Recommended regular with thin liquids and strategies to reduce aspiration risk.   Possible acute diastolic heart failure, proBNP: 331.   - Changed to once daily diuretics   Mild dehydration, resolved with IVF   MAT and premature atrial beats, likely secondary to respirator distress and bronchodilators and transitioned to NSR on telemetry. Telemetry now discontinued.   Hypertension, blood pressure elevated, possibly due to steroids, however, would tolerate 140s-150s in this 80 yo female. She wants to take as few medications as possible.  - Allergy to CCB  - Avoid hydralazine 2/2 recent MAT  - D/c bisoprolol and trend BP  - Continue prn metoprolol for SBP > 160   Normocytic anemia, hemoglobin trending down slightly.   Thrombocytopenia, likely acute phase reactant and resolved   Leukocytosis, likely secondary to steroids, resolving   Moderate protein-calorie malnutrition, eating well now per family  - On ensure  - Diet regular as tolerated   Anxiety, severe  - Increase clonazepam to 0.5mg  qAM and 1mg  qPM  - Continue prn xanax   Diet: Regular   Access: PIV   Consultations:  Palliative Care  Pulmonary  Discharge Exam: Filed Vitals:   06/09/13 2108 06/09/13 2230 06/10/13 0630 06/10/13 0810  BP:  131/64 141/63   Pulse:  98 80   Temp:  97.5 F (36.4 C) 98.3 F (36.8 C)   TempSrc:  Oral Oral   Resp:  18 16   Height:      Weight:   67.813 kg (149 lb 8 oz)   SpO2: 92% 98% 98% 97%    General: Awake, in nad Cardiovascular: regular, s1, s2 Respiratory: normal resp effort, no wheezing  Discharge Instructions       Future  Appointments Provider Department Dept Phone   07/28/2013 3:00 PM Lucky Cowboy, MD [facility] [phone]       Medication List    STOP taking these medications       budesonide-formoterol 160-4.5 MCG/ACT inhaler  Commonly known as:  SYMBICORT     tiotropium 18 MCG inhalation capsule  Commonly known as:  SPIRIVA      TAKE these medications       albuterol (2.5 MG/3ML) 0.083% nebulizer solution  Commonly known as:  PROVENTIL  Take  2.5 mg by nebulization every 6 (six) hours as needed for wheezing or shortness of breath.     ALPRAZolam 1 MG tablet  Commonly known as:  XANAX  Take 1 tablet (1 mg total) by mouth every 4 (four) hours as needed for anxiety or sleep.     arformoterol 15 MCG/2ML Nebu  Commonly known as:  BROVANA  Take 2 mLs (15 mcg total) by nebulization 2 (two) times daily.     aspirin EC 81 MG tablet  Take 81 mg by mouth daily.     beta carotene w/minerals tablet  Take 1 tablet by mouth daily.     budesonide 0.5 MG/2ML nebulizer solution  Commonly known as:  PULMICORT  Take 2 mLs (0.5 mg total) by nebulization 2 (two) times daily.     cholecalciferol 1000 UNITS tablet  Commonly known as:  VITAMIN D  Take 1,000 Units by mouth daily.     clonazePAM 0.5 MG tablet  Commonly known as:  KLONOPIN  Take 1 tablet (0.5 mg total) by mouth daily.     clonazePAM 1 MG tablet  Commonly known as:  KLONOPIN  Take 1 tablet (1 mg total) by mouth at bedtime.     DSS 100 MG Caps  Take 200 mg by mouth 2 (two) times daily.     ferrous sulfate 325 (65 FE) MG tablet  Take 325 mg by mouth daily with breakfast.     fluticasone 50 MCG/ACT nasal spray  Commonly known as:  FLONASE  Place 2 sprays into both nostrils daily.     furosemide 20 MG tablet  Commonly known as:  LASIX  Take 3 tablets (60 mg total) by mouth daily.     ipratropium 0.02 % nebulizer solution  Commonly known as:  ATROVENT  Take 2.5 mLs (0.5 mg total) by nebulization 2 (two) times daily.     latanoprost 0.005 % ophthalmic solution  Commonly known as:  XALATAN  Place 1 drop into the right eye at bedtime.     levalbuterol 0.63 MG/3ML nebulizer solution  Commonly known as:  XOPENEX  Take 3 mLs (0.63 mg total) by nebulization 3 (three) times daily.     losartan 50 MG tablet  Commonly known as:  COZAAR  Take 50 mg by mouth daily.     morphine 15 MG tablet  Commonly known as:  MSIR  Take 1 tablet (15 mg total) by mouth every 4 (four)  hours as needed for moderate pain or severe pain (shortness of breath, cough).     pantoprazole 40 MG tablet  Commonly known as:  PROTONIX  Take 1 tablet (40 mg total) by mouth daily.     pravastatin 40 MG tablet  Commonly known as:  PRAVACHOL  Take 40 mg by mouth at bedtime.     predniSONE 20 MG tablet  Commonly known as:  DELTASONE  Take 3 tablets (60 mg total) by mouth daily with breakfast.  zafirlukast 20 MG tablet  Commonly known as:  ACCOLATE  Take 20 mg by mouth 2 (two) times daily.       Allergies  Allergen Reactions  . Ciprofloxacin     REACTION: nausea  . Erythromycin     REACTION: rash  . Penicillins     REACTION: nausea  . Verapamil     REACTION: nausea   Follow-up Information   Follow up with Nadean Corwin, MD In 2 weeks.   Specialty:  Internal Medicine   Contact information:   [address] [place] [address] [phone]       Follow up with [facility]. Schedule an appointment as soon as possible for a visit in 2 weeks.   Specialty:  Pulmonology   Contact information:   [address] [place] [address] [phone]      Schedule an appointment as soon as possible for a visit with Nadean Corwin, MD. (As needed)    Specialty:  Internal Medicine   Contact information:   [address] [place] [address] [phone]        The results of significant diagnostics from this hospitalization (including imaging, microbiology, ancillary and laboratory) are listed below for reference.    Significant Diagnostic Studies: Dg Chest 2 View  06/07/2013   CLINICAL DATA:  COPD exacerbation.  EXAM: CHEST  2 VIEW  COMPARISON:  PA and lateral chest 05/04/2012 and CT PA and lateral chest 05/04/2012. Portable chest 06/03/2013.  FINDINGS: Changes of COPD are again seen. There is no consolidative process, pneumothorax or effusion. Heart size is upper normal.  IMPRESSION: COPD without acute  disease.   Electronically Signed   By: Drusilla Kanner M.D.   On: 06/07/2013 10:29   Dg Chest Port 1 View  06/03/2013   CLINICAL DATA:  Shortness of breath  EXAM: PORTABLE CHEST - 1 VIEW  COMPARISON:  05/04/2012  FINDINGS: Hyperinflation and chronic interstitial coarsening. No infiltrate or edema. No effusion or pneumothorax. Normal heart size.  IMPRESSION: COPD without evidence of acute superimposed disease.   Electronically Signed   By: Tiburcio Pea M.D.   On: 06/03/2013 02:40    Microbiology: Recent Results (from the past 240 hour(s))  MRSA PCR SCREENING     Status: None   Collection Time    06/03/13  5:28 AM      Result Value Range Status   MRSA by PCR NEGATIVE  NEGATIVE Final   Comment:            The GeneXpert MRSA Assay (FDA     approved for NASAL specimens     only), is one component of a     comprehensive MRSA colonization     surveillance program. It is not     intended to diagnose MRSA     infection nor to guide or     monitor treatment for     MRSA infections.     Labs: Basic Metabolic Panel:  Recent Labs Lab 06/06/13 0500 06/07/13 0517 06/08/13 0543 06/09/13 0603 06/10/13 0603  NA 135* 134* 134* 136* 137  K 4.8 4.9 4.8 4.2 4.0  CL 94* 95* 91* 91* 92*  CO2 34* 34* 37* 38* 39*  GLUCOSE 110* 91 87 103* 85  BUN 26* 21 25* 26* 17  CREATININE 0.80 0.73 0.91 0.97 0.79  CALCIUM 8.4 8.4 8.7 8.8 8.4   Liver Function Tests: No results found for this basename: AST, ALT, ALKPHOS, BILITOT, PROT,  ALBUMIN,  in the last 168 hours No results found for this basename: LIPASE, AMYLASE,  in the last 168 hours No results found for this basename: AMMONIA,  in the last 168 hours CBC:  Recent Labs Lab 06/04/13 0615 06/06/13 1720 06/08/13 1659  WBC 14.8* 16.2* 9.9  HGB 9.7* 11.7* 10.3*  HCT 29.2* 34.6* 30.5*  MCV 93.9 94.0 94.7  PLT 121* 185 150   Cardiac Enzymes: No results found for this basename: CKTOTAL, CKMB, CKMBINDEX, TROPONINI,  in the last 168  hours BNP: BNP (last 3 results)  Recent Labs  06/03/13 0147  PROBNP 331.7   CBG: No results found for this basename: GLUCAP,  in the last 168 hours  Signed:  Renad Jenniges K  [name] 06/10/2013, 3:19 PM

## 2013-06-10 NOTE — Progress Notes (Signed)
Clinical Social Work Department CLINICAL SOCIAL WORK PLACEMENT NOTE 06/10/2013  Patient:  Tonya [name],Tonya [name]  Account Number:  [id number] Admit date:  06/03/2013  Clinical Social Worker:  Cori RazorJAMIE Azir Muzyka, LCSW  Date/time:  06/06/2013 02:12 PM  Clinical Social Work is seeking post-discharge placement for this patient at the following level of care:   SKILLED NURSING   (*CSW will update this form in Epic as items are completed)   06/06/2013  Patient/family provided with [facility] Department of Clinical Social Work's list of facilities offering this level of care within the geographic area requested by the patient (or if unable, by the patient's family).  06/06/2013  Patient/family informed of their freedom to choose among providers that offer the needed level of care, that participate in Medicare, Medicaid or managed care program needed by the patient, have an available bed and are willing to accept the patient.    Patient/family informed of [facility]' ownership interest in [facility], as well as of the fact that they are under no obligation to receive care at this facility.  PASARR submitted to EDS on 06/06/2013 PASARR number received from EDS on 06/06/2013  FL2 transmitted to all facilities in geographic area requested by pt/family on  06/06/2013 FL2 transmitted to all facilities within larger geographic area on   Patient informed that his/her managed care company has contracts with or will negotiate with  certain facilities, including the following:     Patient/family informed of bed offers received:  06/06/2013 [facility] [place] Physician recommends and patient chooses bed at    [facility] [place] on  06/10/2013 Patient to be transferred to facility by [facility]  The following physician request were entered in Epic:   Additional Comments: Cori RazorJamie Janeliz [place] LCSW  [phone]

## 2013-06-11 ENCOUNTER — Encounter (HOSPITAL_COMMUNITY): Payer: Self-pay | Admitting: Emergency Medicine

## 2013-06-11 ENCOUNTER — Inpatient Hospital Stay (HOSPITAL_COMMUNITY)
Admission: EM | Admit: 2013-06-11 | Discharge: 2013-06-14 | DRG: 309 | Disposition: A | Discharge status: Hospice | Payer: Medicare Other | Attending: Cardiology | Admitting: Cardiology

## 2013-06-11 ENCOUNTER — Emergency Department (HOSPITAL_COMMUNITY): Payer: Medicare Other

## 2013-06-11 DIAGNOSIS — Z87891 Personal history of nicotine dependence: Secondary | ICD-10-CM

## 2013-06-11 DIAGNOSIS — F0390 Unspecified dementia without behavioral disturbance: Secondary | ICD-10-CM | POA: Diagnosis present

## 2013-06-11 DIAGNOSIS — J9600 Acute respiratory failure, unspecified whether with hypoxia or hypercapnia: Secondary | ICD-10-CM

## 2013-06-11 DIAGNOSIS — E46 Unspecified protein-calorie malnutrition: Secondary | ICD-10-CM | POA: Diagnosis present

## 2013-06-11 DIAGNOSIS — Z6825 Body mass index (BMI) 25.0-25.9, adult: Secondary | ICD-10-CM

## 2013-06-11 DIAGNOSIS — J4489 Other specified chronic obstructive pulmonary disease: Secondary | ICD-10-CM | POA: Diagnosis present

## 2013-06-11 DIAGNOSIS — J449 Chronic obstructive pulmonary disease, unspecified: Secondary | ICD-10-CM

## 2013-06-11 DIAGNOSIS — I4892 Unspecified atrial flutter: Principal | ICD-10-CM | POA: Diagnosis present

## 2013-06-11 DIAGNOSIS — Z66 Do not resuscitate: Secondary | ICD-10-CM | POA: Diagnosis present

## 2013-06-11 DIAGNOSIS — I10 Essential (primary) hypertension: Secondary | ICD-10-CM

## 2013-06-11 DIAGNOSIS — I4891 Unspecified atrial fibrillation: Secondary | ICD-10-CM

## 2013-06-11 LAB — URINALYSIS, ROUTINE W REFLEX MICROSCOPIC
BILIRUBIN URINE: NEGATIVE
GLUCOSE, UA: NEGATIVE mg/dL
HGB URINE DIPSTICK: NEGATIVE
KETONES UR: NEGATIVE mg/dL
Nitrite: NEGATIVE
PH: 8 (ref 5.0–8.0)
PROTEIN: NEGATIVE mg/dL
Specific Gravity, Urine: 1.009 (ref 1.005–1.030)
Urobilinogen, UA: 0.2 mg/dL (ref 0.0–1.0)

## 2013-06-11 LAB — BASIC METABOLIC PANEL
BUN: 20 mg/dL (ref 6–23)
CALCIUM: 8.9 mg/dL (ref 8.4–10.5)
CO2: 40 meq/L — AB (ref 19–32)
CREATININE: 0.79 mg/dL (ref 0.50–1.10)
Chloride: 90 mEq/L — ABNORMAL LOW (ref 96–112)
GFR calc Af Amer: 84 mL/min — ABNORMAL LOW (ref >90)
GFR, EST NON AFRICAN AMERICAN: 72 mL/min — AB (ref >90)
GLUCOSE: 150 mg/dL — AB (ref 70–99)
Potassium: 4.3 mEq/L (ref 3.7–5.3)
Sodium: 136 mEq/L — ABNORMAL LOW (ref 137–147)

## 2013-06-11 LAB — CBC WITH DIFFERENTIAL/PLATELET
Basophils Absolute: 0 10*3/uL (ref 0.0–0.1)
Basophils Relative: 0 % (ref 0–1)
EOS ABS: 0 10*3/uL (ref 0.0–0.7)
EOS PCT: 0 % (ref 0–5)
HEMATOCRIT: 32.1 % — AB (ref 36.0–46.0)
Hemoglobin: 10.6 g/dL — ABNORMAL LOW (ref 12.0–15.0)
LYMPHS ABS: 0.5 10*3/uL — AB (ref 0.7–4.0)
LYMPHS PCT: 5 % — AB (ref 12–46)
MCH: 31.5 pg (ref 26.0–34.0)
MCHC: 33 g/dL (ref 30.0–36.0)
MCV: 95.3 fL (ref 78.0–100.0)
MONO ABS: 0.6 10*3/uL (ref 0.1–1.0)
Monocytes Relative: 6 % (ref 3–12)
Neutro Abs: 9 10*3/uL — ABNORMAL HIGH (ref 1.7–7.7)
Neutrophils Relative %: 90 % — ABNORMAL HIGH (ref 43–77)
Platelets: 176 10*3/uL (ref 150–400)
RBC: 3.37 MIL/uL — AB (ref 3.87–5.11)
RDW: 12.8 % (ref 11.5–15.5)
WBC: 10.1 10*3/uL (ref 4.0–10.5)

## 2013-06-11 LAB — URINE MICROSCOPIC-ADD ON

## 2013-06-11 LAB — TROPONIN I: Troponin I: <0.3 ng/mL (ref <0.30)

## 2013-06-11 MED ORDER — ALBUTEROL SULFATE (2.5 MG/3ML) 0.083% IN NEBU
2.5000 mg | INHALATION_SOLUTION | Freq: Once | RESPIRATORY_TRACT | Status: DC
  Filled 2013-06-11: qty 3

## 2013-06-11 MED ORDER — DILTIAZEM HCL 25 MG/5ML IV SOLN
10.0000 mg | Freq: Once | INTRAVENOUS | Status: AC
  Administered 2013-06-11: 10 mg via INTRAVENOUS
  Filled 2013-06-11: qty 5

## 2013-06-11 MED ORDER — DILTIAZEM HCL 100 MG IV SOLR
5.0000 mg/h | INTRAVENOUS | Status: DC
  Administered 2013-06-11: 15 mg/h via INTRAVENOUS
  Administered 2013-06-11: 5 mg/h via INTRAVENOUS
  Administered 2013-06-12: 12 mg/h via INTRAVENOUS
  Administered 2013-06-12 – 2013-06-13 (×2): 5 mg/h via INTRAVENOUS
  Filled 2013-06-11 (×2): qty 100

## 2013-06-11 MED ORDER — LEVALBUTEROL HCL 0.63 MG/3ML IN NEBU
0.6300 mg | INHALATION_SOLUTION | Freq: Once | RESPIRATORY_TRACT | Status: AC
  Administered 2013-06-11: 0.63 mg via RESPIRATORY_TRACT

## 2013-06-11 MED ORDER — SODIUM CHLORIDE 0.9 % IV BOLUS (SEPSIS)
500.0000 mL | Freq: Once | INTRAVENOUS | Status: AC
  Administered 2013-06-11: 500 mL via INTRAVENOUS

## 2013-06-11 MED ORDER — LEVALBUTEROL HCL 0.63 MG/3ML IN NEBU
INHALATION_SOLUTION | RESPIRATORY_TRACT | Status: AC
  Filled 2013-06-11: qty 3

## 2013-06-11 NOTE — ED Notes (Signed)
Per EMS, [facility]. Pt was released back to nursing home last night and the nursing home called because she was tachycardia. A&Ox4. Pt has wheezing. Pt refused albuterol treatment.

## 2013-06-11 NOTE — ED Notes (Signed)
Bed: [id number] Expected date: 06/11/13 Expected time: 6:29 PM Means of arrival:  Comments: A fib, weakness, DNR pallative care

## 2013-06-11 NOTE — ED Notes (Signed)
Per family, Pt has not been herself today. Family thought it was related to being pu tin nursing home, that pt "was pouting and withdrawn." Family sts pt seemed more lethargic and "less bubbly" than usual.

## 2013-06-11 NOTE — ED Notes (Signed)
Critical Lab: CO2 = 40

## 2013-06-11 NOTE — ED Notes (Signed)
Pt is unable to urinate at this time. 

## 2013-06-11 NOTE — ED Notes (Signed)
Respiratory called to do Neb treatment.

## 2013-06-11 NOTE — ED Provider Notes (Signed)
CSN: [id number]     Arrival date & time 06/11/13  1832 History   First MD Initiated Contact with Patient 06/11/13 1840     Chief Complaint  Patient presents with  . Tachycardia   (Consider location/radiation/quality/duration/timing/severity/associated sxs/prior Treatment) HPI Comments: Pt sent from SNF with tachycardia in 150's last night which continued to be elevated this morning.  Pt denies CP, SOB above baseline, cough, fever, abdominal pain, n/v, d/a.  No exacerbating or alleviating factors.  No associated factors. She had recent admission for COPD/pna, was d/c to SNF last night.   The history is provided by the patient. No language interpreter was used.    Past Medical History  Diagnosis Date  . Emphysema   . Hypertension   . Pneumonia    Past Surgical History  Procedure Laterality Date  . Abdominal hysterectomy    . Hernia repair      inguinal  . Elbow surgery     Family History  Problem Relation Age of Onset  . Stroke Mother   . Kidney failure Father    History  Substance Use Topics  . Smoking status: Former Smoker -- 0.50 packs/day for 60 years    Types: Cigarettes  . Smokeless tobacco: Not on file  . Alcohol Use: No   OB History   Grav Para Term Preterm Abortions TAB SAB Ect Mult Living                 Review of Systems  Constitutional: Negative for fever, chills, diaphoresis, activity change, appetite change and fatigue.  HENT: Negative for congestion, facial swelling, rhinorrhea and sore throat.   Eyes: Negative for photophobia and discharge.  Respiratory: Negative for cough, chest tightness and shortness of breath.   Cardiovascular: Negative for chest pain, palpitations and leg swelling.  Gastrointestinal: Negative for nausea, vomiting, abdominal pain and diarrhea.  Endocrine: Negative for polydipsia and polyuria.  Genitourinary: Negative for dysuria, frequency, difficulty urinating and pelvic pain.  Musculoskeletal: Negative for arthralgias, back  pain, neck pain and neck stiffness.  Skin: Negative for color change and wound.  Allergic/Immunologic: Negative for immunocompromised state.  Neurological: Negative for facial asymmetry, weakness, numbness and headaches.  Hematological: Does not bruise/bleed easily.  Psychiatric/Behavioral: Negative for confusion and agitation.    Allergies  Ciprofloxacin; Erythromycin; Penicillins; and Verapamil  Home Medications   Current Outpatient Rx  Name  Route  Sig  Dispense  Refill  . albuterol (PROVENTIL) (2.5 MG/3ML) 0.083% nebulizer solution   Nebulization   Take 2.5 mg by nebulization every 6 (six) hours as needed for wheezing or shortness of breath.         . ALPRAZolam (XANAX) 1 MG tablet   Oral   Take 1 tablet (1 mg total) by mouth every 4 (four) hours as needed for anxiety or sleep.   30 tablet   0   . arformoterol (BROVANA) 15 MCG/2ML NEBU   Nebulization   Take 2 mLs (15 mcg total) by nebulization 2 (two) times daily.   120 mL   0   . aspirin EC 81 MG tablet   Oral   Take 81 mg by mouth daily.         . beta carotene w/minerals (OCUVITE) tablet   Oral   Take 1 tablet by mouth daily.         . budesonide (PULMICORT) 0.5 MG/2ML nebulizer solution   Nebulization   Take 2 mLs (0.5 mg total) by nebulization 2 (two) times daily.  0     Dispense one month supply, zero refills   . cholecalciferol (VITAMIN D) 1000 UNITS tablet   Oral   Take 1,000 Units by mouth daily.         . clonazePAM (KLONOPIN) 0.5 MG tablet   Oral   Take 1 tablet (0.5 mg total) by mouth daily.   30 tablet   0   . clonazePAM (KLONOPIN) 1 MG tablet   Oral   Take 1 tablet (1 mg total) by mouth at bedtime.   30 tablet   0   . docusate sodium 100 MG CAPS   Oral   Take 200 mg by mouth 2 (two) times daily.   10 capsule   0   . ferrous sulfate 325 (65 FE) MG tablet   Oral   Take 325 mg by mouth daily with breakfast.         . fluticasone (FLONASE) 50 MCG/ACT nasal spray    Each Nare   Place 2 sprays into both nostrils daily.           Dispense 1 mo supply, zero refills   . furosemide (LASIX) 20 MG tablet   Oral   Take 3 tablets (60 mg total) by mouth daily.           1 mo supply, zero refills   . ipratropium (ATROVENT) 0.02 % nebulizer solution   Nebulization   Take 2.5 mLs (0.5 mg total) by nebulization 2 (two) times daily.   75 mL   0   . latanoprost (XALATAN) 0.005 % ophthalmic solution   Right Eye   Place 1 drop into the right eye at bedtime.          . levalbuterol (XOPENEX) 0.63 MG/3ML nebulizer solution   Nebulization   Take 3 mLs (0.63 mg total) by nebulization 3 (three) times daily.   300 mL   0   . losartan (COZAAR) 50 MG tablet   Oral   Take 50 mg by mouth daily.         Marland Kitchen morphine (MSIR) 15 MG tablet   Oral   Take 1 tablet (15 mg total) by mouth every 4 (four) hours as needed for moderate pain or severe pain (shortness of breath, cough).   30 tablet   0   . pantoprazole (PROTONIX) 40 MG tablet   Oral   Take 1 tablet (40 mg total) by mouth daily.         . polyethylene glycol (MIRALAX / GLYCOLAX) packet   Oral   Take 17 g by mouth daily.         . pravastatin (PRAVACHOL) 40 MG tablet   Oral   Take 40 mg by mouth at bedtime.         . predniSONE (DELTASONE) 20 MG tablet   Oral   Take 3 tablets (60 mg total) by mouth daily with breakfast.           60mg  po daily, 30 day supply, refills and/or taper ...   . sennosides-docusate sodium (SENOKOT-S) 8.6-50 MG tablet   Oral   Take 1 tablet by mouth daily as needed for constipation (CONSTIPATION).         Marland Kitchen zafirlukast (ACCOLATE) 20 MG tablet   Oral   Take 20 mg by mouth 2 (two) times daily.          BP 98/46  Pulse 51  Temp(Src) 97.9 F (36.6 C) (Oral)  Resp 15  SpO2 97%  Physical Exam  Constitutional: She is oriented to person, place, and time. She appears well-developed and well-nourished. No distress.  HENT:  Head: Normocephalic and  atraumatic.  Mouth/Throat: No oropharyngeal exudate.  Eyes: Pupils are equal, round, and reactive to light.  Neck: Normal range of motion. Neck supple.  Cardiovascular: Regular rhythm and normal heart sounds.  Tachycardia present.  Exam reveals no gallop and no friction rub.   No murmur heard. Pulmonary/Chest: Effort normal. No respiratory distress. She has wheezes in the right middle field, the right lower field, the left middle field and the left lower field. She has no rales.  Abdominal: Soft. Bowel sounds are normal. She exhibits no distension and no mass. There is no tenderness. There is no rebound and no guarding.  Musculoskeletal: Normal range of motion. She exhibits no edema and no tenderness.  Neurological: She is alert and oriented to person, place, and time.  Skin: Skin is warm and dry.  Psychiatric: She has a normal mood and affect.    ED Course  Procedures (including critical care time) Labs Review Labs Reviewed  CBC WITH DIFFERENTIAL - Abnormal; Notable for the following:    RBC 3.37 (*)    Hemoglobin 10.6 (*)    HCT 32.1 (*)    Neutrophils Relative % 90 (*)    Neutro Abs 9.0 (*)    Lymphocytes Relative 5 (*)    Lymphs Abs 0.5 (*)    All other components within normal limits  BASIC METABOLIC PANEL - Abnormal; Notable for the following:    Sodium 136 (*)    Chloride 90 (*)    CO2 40 (*)    Glucose, Bld 150 (*)    GFR calc non Af Amer 72 (*)    GFR calc Af Amer 84 (*)    All other components within normal limits  URINALYSIS, ROUTINE W REFLEX MICROSCOPIC - Abnormal; Notable for the following:    APPearance CLOUDY (*)    Leukocytes, UA LARGE (*)    All other components within normal limits  URINE MICROSCOPIC-ADD ON - Abnormal; Notable for the following:    Squamous Epithelial / LPF MANY (*)    All other components within normal limits  URINE CULTURE  TROPONIN I   Imaging Review Dg Chest 2 View  06/11/2013   CLINICAL DATA:  Tachycardia and low grade fever.   EXAM: CHEST  2 VIEW  COMPARISON:  06/07/2013  FINDINGS: Heart size is normal. There is no pleural effusion or edema noted. Chronic interstitial coarsening is identified bilaterally. No superimposed airspace consolidation identified. Mild spondylosis noted within the lower thoracic spine.  IMPRESSION: 1. COPD. 2. No pneumonia   Electronically Signed   By: Signa Kell M.D.   On: 06/11/2013 19:42    EKG Interpretation    Date/Time:  Saturday June 11 2013 22:45:32 EST Ventricular Rate:  125 PR Interval:  157 QRS Duration: 93 QT Interval:  317 QTC Calculation: 457 R Axis:   47 Text Interpretation:  Atrial flutter with 2:1 AV block Multiple ventricular premature complexes ST depression, probably rate related Minimal ST elevation, lateral leads Confirmed by DOCHERTY  MD, MEGAN ([id number]) on 06/11/2013 10:50:21 PM            MDM   1. Atrial flutter with rapid ventricular response    Pt is a 80 y.o. female with Pmhx as above who presents with tachycardia from SNF which she was just admitted to yesterday.  In hospital she had MAT and premature atrial  beats, thought likely secondary to respiratory distress per chart.  Currently pt denies CP, SOB, ab pain, cough, n/v, d/a. Daughter reports she is DNR/DNI, but they have not come to conclusion of comfort care.  She has been refusing albuterol and prednisone. CXR w/ COPD, no pna. Trop not elevated.  CO2 chronically elevated.  Initial EKG w/ narrow complex tachycardia at 150, which became more clear to be atrial flutter with administration of dilt.  Pt's rate around 90-110 on dilt gtt at 12.  Cardiology consulted, will be admitted to [facility] cards service to step down. Pt and family in agreement.        Shanna Cisco, MD 06/11/13 [phone]

## 2013-06-12 DIAGNOSIS — I4892 Unspecified atrial flutter: Principal | ICD-10-CM

## 2013-06-12 DIAGNOSIS — J9600 Acute respiratory failure, unspecified whether with hypoxia or hypercapnia: Secondary | ICD-10-CM

## 2013-06-12 DIAGNOSIS — J449 Chronic obstructive pulmonary disease, unspecified: Secondary | ICD-10-CM

## 2013-06-12 DIAGNOSIS — I10 Essential (primary) hypertension: Secondary | ICD-10-CM

## 2013-06-12 DIAGNOSIS — I4891 Unspecified atrial fibrillation: Secondary | ICD-10-CM

## 2013-06-12 DIAGNOSIS — E46 Unspecified protein-calorie malnutrition: Secondary | ICD-10-CM

## 2013-06-12 LAB — CBC
HCT: 31.1 % — ABNORMAL LOW (ref 36.0–46.0)
HEMOGLOBIN: 10.4 g/dL — AB (ref 12.0–15.0)
MCH: 31.8 pg (ref 26.0–34.0)
MCHC: 33.4 g/dL (ref 30.0–36.0)
MCV: 95.1 fL (ref 78.0–100.0)
Platelets: 164 10*3/uL (ref 150–400)
RBC: 3.27 MIL/uL — AB (ref 3.87–5.11)
RDW: 12.9 % (ref 11.5–15.5)
WBC: 13.2 10*3/uL — ABNORMAL HIGH (ref 4.0–10.5)

## 2013-06-12 LAB — T4, FREE: Free T4: 1.45 ng/dL (ref 0.80–1.80)

## 2013-06-12 LAB — COMPREHENSIVE METABOLIC PANEL
ALBUMIN: 2.7 g/dL — AB (ref 3.5–5.2)
ALT: 24 U/L (ref 0–35)
AST: 21 U/L (ref 0–37)
Alkaline Phosphatase: 41 U/L (ref 39–117)
BUN: 19 mg/dL (ref 6–23)
CO2: 39 mEq/L — ABNORMAL HIGH (ref 19–32)
Calcium: 8.7 mg/dL (ref 8.4–10.5)
Chloride: 96 mEq/L (ref 96–112)
Creatinine, Ser: 0.91 mg/dL (ref 0.50–1.10)
GFR calc Af Amer: 63 mL/min — ABNORMAL LOW (ref >90)
GFR calc non Af Amer: 55 mL/min — ABNORMAL LOW (ref >90)
Glucose, Bld: 111 mg/dL — ABNORMAL HIGH (ref 70–99)
Potassium: 4 mEq/L (ref 3.7–5.3)
SODIUM: 142 meq/L (ref 137–147)
TOTAL PROTEIN: 5.5 g/dL — AB (ref 6.0–8.3)
Total Bilirubin: 0.2 mg/dL — ABNORMAL LOW (ref 0.3–1.2)

## 2013-06-12 LAB — MAGNESIUM: MAGNESIUM: 1.9 mg/dL (ref 1.5–2.5)

## 2013-06-12 LAB — TSH: TSH: 0.552 u[IU]/mL (ref 0.350–4.500)

## 2013-06-12 LAB — PRO B NATRIURETIC PEPTIDE: Pro B Natriuretic peptide (BNP): 3418 pg/mL — ABNORMAL HIGH (ref 0–450)

## 2013-06-12 LAB — MRSA PCR SCREENING: MRSA by PCR: NEGATIVE

## 2013-06-12 MED ORDER — CLONAZEPAM 1 MG PO TABS
1.0000 mg | ORAL_TABLET | Freq: Every day | ORAL | Status: DC
  Administered 2013-06-12 – 2013-06-13 (×2): 1 mg via ORAL
  Filled 2013-06-12 (×3): qty 1

## 2013-06-12 MED ORDER — BUDESONIDE 0.5 MG/2ML IN SUSP
0.5000 mg | Freq: Two times a day (BID) | RESPIRATORY_TRACT | Status: DC
  Administered 2013-06-12 – 2013-06-14 (×5): 0.5 mg via RESPIRATORY_TRACT
  Filled 2013-06-12 (×7): qty 2

## 2013-06-12 MED ORDER — POLYETHYLENE GLYCOL 3350 17 G PO PACK
17.0000 g | PACK | Freq: Every day | ORAL | Status: DC
  Administered 2013-06-12 – 2013-06-14 (×3): 17 g via ORAL
  Filled 2013-06-12 (×3): qty 1

## 2013-06-12 MED ORDER — ALPRAZOLAM 0.5 MG PO TABS
1.0000 mg | ORAL_TABLET | ORAL | Status: DC | PRN
  Administered 2013-06-12 – 2013-06-14 (×2): 1 mg via ORAL
  Filled 2013-06-12 (×2): qty 2

## 2013-06-12 MED ORDER — FUROSEMIDE 40 MG PO TABS
60.0000 mg | ORAL_TABLET | Freq: Every day | ORAL | Status: DC
  Administered 2013-06-12 – 2013-06-14 (×3): 60 mg via ORAL
  Filled 2013-06-12 (×3): qty 1

## 2013-06-12 MED ORDER — SIMVASTATIN 20 MG PO TABS
20.0000 mg | ORAL_TABLET | Freq: Every day | ORAL | Status: DC
  Filled 2013-06-12: qty 1

## 2013-06-12 MED ORDER — MORPHINE SULFATE 15 MG PO TABS
15.0000 mg | ORAL_TABLET | ORAL | Status: DC | PRN
  Administered 2013-06-12 – 2013-06-13 (×2): 15 mg via ORAL
  Filled 2013-06-12 (×2): qty 1

## 2013-06-12 MED ORDER — LOSARTAN POTASSIUM 50 MG PO TABS
50.0000 mg | ORAL_TABLET | Freq: Every day | ORAL | Status: DC
  Administered 2013-06-12 – 2013-06-14 (×2): 50 mg via ORAL
  Filled 2013-06-12 (×3): qty 1

## 2013-06-12 MED ORDER — DOCUSATE SODIUM 100 MG PO CAPS
200.0000 mg | ORAL_CAPSULE | Freq: Two times a day (BID) | ORAL | Status: DC
  Administered 2013-06-12 – 2013-06-14 (×5): 200 mg via ORAL
  Filled 2013-06-12 (×6): qty 2

## 2013-06-12 MED ORDER — FLUTICASONE PROPIONATE 50 MCG/ACT NA SUSP
2.0000 | Freq: Every day | NASAL | Status: DC
  Administered 2013-06-12 – 2013-06-14 (×3): 2 via NASAL
  Filled 2013-06-12: qty 16

## 2013-06-12 MED ORDER — ARFORMOTEROL TARTRATE 15 MCG/2ML IN NEBU
15.0000 ug | INHALATION_SOLUTION | Freq: Two times a day (BID) | RESPIRATORY_TRACT | Status: DC
  Administered 2013-06-12 – 2013-06-14 (×6): 15 ug via RESPIRATORY_TRACT
  Filled 2013-06-12 (×7): qty 2

## 2013-06-12 MED ORDER — LATANOPROST 0.005 % OP SOLN
1.0000 [drp] | Freq: Every day | OPHTHALMIC | Status: DC
  Administered 2013-06-12 – 2013-06-13 (×2): 1 [drp] via OPHTHALMIC
  Filled 2013-06-12 (×2): qty 2.5

## 2013-06-12 MED ORDER — MONTELUKAST SODIUM 10 MG PO TABS
10.0000 mg | ORAL_TABLET | Freq: Every day | ORAL | Status: DC
  Administered 2013-06-12 – 2013-06-13 (×2): 10 mg via ORAL
  Filled 2013-06-12 (×3): qty 1

## 2013-06-12 MED ORDER — SENNA-DOCUSATE SODIUM 8.6-50 MG PO TABS
1.0000 | ORAL_TABLET | Freq: Every day | ORAL | Status: DC | PRN
  Filled 2013-06-12: qty 1

## 2013-06-12 MED ORDER — ONDANSETRON HCL 4 MG/2ML IJ SOLN: 4.0000 mg | Freq: Four times a day (QID) | INTRAMUSCULAR | Status: DC | PRN

## 2013-06-12 MED ORDER — PANTOPRAZOLE SODIUM 40 MG PO TBEC
40.0000 mg | DELAYED_RELEASE_TABLET | Freq: Every day | ORAL | Status: DC
  Administered 2013-06-12 – 2013-06-14 (×3): 40 mg via ORAL
  Filled 2013-06-12 (×3): qty 1

## 2013-06-12 MED ORDER — ASPIRIN EC 81 MG PO TBEC
81.0000 mg | DELAYED_RELEASE_TABLET | Freq: Every day | ORAL | Status: DC
  Administered 2013-06-12 – 2013-06-14 (×3): 81 mg via ORAL
  Filled 2013-06-12 (×3): qty 1

## 2013-06-12 MED ORDER — IPRATROPIUM BROMIDE 0.02 % IN SOLN
0.5000 mg | Freq: Two times a day (BID) | RESPIRATORY_TRACT | Status: DC
  Administered 2013-06-12 – 2013-06-14 (×5): 0.5 mg via RESPIRATORY_TRACT
  Filled 2013-06-12 (×5): qty 2.5

## 2013-06-12 MED ORDER — APIXABAN 2.5 MG PO TABS
2.5000 mg | ORAL_TABLET | Freq: Two times a day (BID) | ORAL | Status: DC
  Administered 2013-06-13 – 2013-06-14 (×3): 2.5 mg via ORAL
  Filled 2013-06-12 (×4): qty 1

## 2013-06-12 MED ORDER — LEVALBUTEROL HCL 0.63 MG/3ML IN NEBU
0.6300 mg | INHALATION_SOLUTION | RESPIRATORY_TRACT | Status: DC | PRN
  Administered 2013-06-13 – 2013-06-14 (×2): 0.63 mg via RESPIRATORY_TRACT
  Filled 2013-06-12 (×2): qty 3

## 2013-06-12 MED ORDER — PRAVASTATIN SODIUM 40 MG PO TABS
40.0000 mg | ORAL_TABLET | Freq: Every day | ORAL | Status: DC
  Administered 2013-06-12 – 2013-06-14 (×3): 40 mg via ORAL
  Filled 2013-06-12 (×3): qty 1

## 2013-06-12 MED ORDER — ACETAMINOPHEN 325 MG PO TABS: 650.0000 mg | ORAL_TABLET | ORAL | Status: DC | PRN

## 2013-06-12 MED ORDER — CLONAZEPAM 0.5 MG PO TABS
0.5000 mg | ORAL_TABLET | Freq: Every day | ORAL | Status: DC
  Administered 2013-06-12 – 2013-06-14 (×3): 0.5 mg via ORAL
  Filled 2013-06-12 (×3): qty 1

## 2013-06-12 NOTE — Progress Notes (Signed)
For full dictation please see fellow's H&P note from this morning. The patient admitted in atrial flutter with RVR, started on cardizem drip, currently hypotensive, cardizem held. On Eliquis that was started today. If no cardioversion by tomorrow, we will plan for TEE/CV. No prior echocardiogram. There was a question about palliative care, that has not been clarified yet. For now we will treat aggressively.   Tonya [name], Tierre Gerard, Rexene EdisonH 06/12/2013

## 2013-06-12 NOTE — H&P (Signed)
Tonya [name] is an 80 y.o. female.   Chief Complaint: tachycardia HPI: Tonya [name] is an 80 year old female with a past medical history of dementia, COPD and hypertension. She was recently discharged from [facility] after an admission for COPD exacerbation. She was brought back to the ED because of tachycardia. In the ED she was found to have atrial flutter with 2:1 AV block. She was started on a diltiazem drip which has controlled her rate but she has not converted to sinus rhythm. Tonya [name] denies any complaints at this time. She is unable to provide much more detail about her past medical history or her current state. Unfortunately I missed her family who were here earlier.   Past Medical History  Diagnosis Date  . Emphysema   . Hypertension   . Pneumonia     Past Surgical History  Procedure Laterality Date  . Abdominal hysterectomy    . Hernia repair      inguinal  . Elbow surgery      Family History  Problem Relation Age of Onset  . Stroke Mother   . Kidney failure Father    Social History:  reports that she has quit smoking. Her smoking use included Cigarettes. She has a 30 pack-year smoking history. She does not have any smokeless tobacco history on file. She reports that she does not drink alcohol or use illicit drugs.  Allergies:  Allergies  Allergen Reactions  . Ciprofloxacin     REACTION: nausea  . Erythromycin     REACTION: rash  . Penicillins     REACTION: nausea  . Shellfish Allergy Hives  . Verapamil     REACTION: nausea  . Sulfa Antibiotics Rash    Medications Prior to Admission  Medication Sig Dispense Refill  . albuterol (PROVENTIL) (2.5 MG/3ML) 0.083% nebulizer solution Take 2.5 mg by nebulization every 6 (six) hours as needed for wheezing or shortness of breath.      . ALPRAZolam (XANAX) 1 MG tablet Take 1 tablet (1 mg total) by mouth every 4 (four) hours as needed for anxiety or sleep.  30 tablet  0  . arformoterol (BROVANA) 15 MCG/2ML NEBU  Take 2 mLs (15 mcg total) by nebulization 2 (two) times daily.  120 mL  0  . aspirin EC 81 MG tablet Take 81 mg by mouth daily.      . beta carotene w/minerals (OCUVITE) tablet Take 1 tablet by mouth daily.      . budesonide (PULMICORT) 0.5 MG/2ML nebulizer solution Take 2 mLs (0.5 mg total) by nebulization 2 (two) times daily.    0  . cholecalciferol (VITAMIN D) 1000 UNITS tablet Take 1,000 Units by mouth daily.      . clonazePAM (KLONOPIN) 0.5 MG tablet Take 1 tablet (0.5 mg total) by mouth daily.  30 tablet  0  . clonazePAM (KLONOPIN) 1 MG tablet Take 1 tablet (1 mg total) by mouth at bedtime.  30 tablet  0  . docusate sodium 100 MG CAPS Take 200 mg by mouth 2 (two) times daily.  10 capsule  0  . ferrous sulfate 325 (65 FE) MG tablet Take 325 mg by mouth daily with breakfast.      . fluticasone (FLONASE) 50 MCG/ACT nasal spray Place 2 sprays into both nostrils daily.      . furosemide (LASIX) 20 MG tablet Take 3 tablets (60 mg total) by mouth daily.      Marland Kitchen ipratropium (ATROVENT) 0.02 % nebulizer solution  Take 2.5 mLs (0.5 mg total) by nebulization 2 (two) times daily.  75 mL  0  . latanoprost (XALATAN) 0.005 % ophthalmic solution Place 1 drop into the right eye at bedtime.       . levalbuterol (XOPENEX) 0.63 MG/3ML nebulizer solution Take 3 mLs (0.63 mg total) by nebulization 3 (three) times daily.  300 mL  0  . losartan (COZAAR) 50 MG tablet Take 50 mg by mouth daily.      Marland Kitchen morphine (MSIR) 15 MG tablet Take 1 tablet (15 mg total) by mouth every 4 (four) hours as needed for moderate pain or severe pain (shortness of breath, cough).  30 tablet  0  . pantoprazole (PROTONIX) 40 MG tablet Take 1 tablet (40 mg total) by mouth daily.      . polyethylene glycol (MIRALAX / GLYCOLAX) packet Take 17 g by mouth daily.      . pravastatin (PRAVACHOL) 40 MG tablet Take 40 mg by mouth at bedtime.      . predniSONE (DELTASONE) 20 MG tablet Take 3 tablets (60 mg total) by mouth daily with breakfast.      .  sennosides-docusate sodium (SENOKOT-S) 8.6-50 MG tablet Take 1 tablet by mouth daily as needed for constipation (CONSTIPATION).      Marland Kitchen zafirlukast (ACCOLATE) 20 MG tablet Take 20 mg by mouth 2 (two) times daily.        Results for orders placed during the hospital encounter of 06/11/13 (from the past 48 hour(s))  CBC WITH DIFFERENTIAL     Status: Abnormal   Collection Time    06/11/13  7:05 PM      Result Value Range   WBC 10.1  4.0 - 10.5 K/uL   RBC 3.37 (*) 3.87 - 5.11 MIL/uL   Hemoglobin 10.6 (*) 12.0 - 15.0 g/dL   HCT 32.1 (*) 36.0 - 46.0 %   MCV 95.3  78.0 - 100.0 fL   MCH 31.5  26.0 - 34.0 pg   MCHC 33.0  30.0 - 36.0 g/dL   RDW 12.8  11.5 - 15.5 %   Platelets 176  150 - 400 K/uL   Neutrophils Relative % 90 (*) 43 - 77 %   Neutro Abs 9.0 (*) 1.7 - 7.7 K/uL   Lymphocytes Relative 5 (*) 12 - 46 %   Lymphs Abs 0.5 (*) 0.7 - 4.0 K/uL   Monocytes Relative 6  3 - 12 %   Monocytes Absolute 0.6  0.1 - 1.0 K/uL   Eosinophils Relative 0  0 - 5 %   Eosinophils Absolute 0.0  0.0 - 0.7 K/uL   Basophils Relative 0  0 - 1 %   Basophils Absolute 0.0  0.0 - 0.1 K/uL  BASIC METABOLIC PANEL     Status: Abnormal   Collection Time    06/11/13  7:05 PM      Result Value Range   Sodium 136 (*) 137 - 147 mEq/L   Potassium 4.3  3.7 - 5.3 mEq/L   Chloride 90 (*) 96 - 112 mEq/L   CO2 40 (*) 19 - 32 mEq/L   Comment: CRITICAL RESULT CALLED TO, READ BACK BY AND VERIFIED WITH:     ANNA SHELL,RN [id number] @ 1951 BY J SCOTTON   Glucose, Bld 150 (*) 70 - 99 mg/dL   BUN 20  6 - 23 mg/dL   Creatinine, Ser 0.79  0.50 - 1.10 mg/dL   Calcium 8.9  8.4 - 10.5 mg/dL   GFR calc  non Af Amer 72 (*) >90 mL/min   GFR calc Af Amer 84 (*) >90 mL/min   Comment: (NOTE)     The eGFR has been calculated using the CKD EPI equation.     This calculation has not been validated in all clinical situations.     eGFR's persistently <90 mL/min signify possible Chronic Kidney     Disease.  TROPONIN I     Status: None    Collection Time    06/11/13  7:05 PM      Result Value Range   Troponin I <0.30  <0.30 ng/mL   Comment:            Due to the release kinetics of cTnI,     a negative result within the first hours     of the onset of symptoms does not rule out     myocardial infarction with certainty.     If myocardial infarction is still suspected,     repeat the test at appropriate intervals.  URINALYSIS, ROUTINE W REFLEX MICROSCOPIC     Status: Abnormal   Collection Time    06/11/13  8:40 PM      Result Value Range   Color, Urine YELLOW  YELLOW   APPearance CLOUDY (*) CLEAR   Specific Gravity, Urine 1.009  1.005 - 1.030   pH 8.0  5.0 - 8.0   Glucose, UA NEGATIVE  NEGATIVE mg/dL   Hgb urine dipstick NEGATIVE  NEGATIVE   Bilirubin Urine NEGATIVE  NEGATIVE   Ketones, ur NEGATIVE  NEGATIVE mg/dL   Protein, ur NEGATIVE  NEGATIVE mg/dL   Urobilinogen, UA 0.2  0.0 - 1.0 mg/dL   Nitrite NEGATIVE  NEGATIVE   Leukocytes, UA LARGE (*) NEGATIVE  URINE MICROSCOPIC-ADD ON     Status: Abnormal   Collection Time    06/11/13  8:40 PM      Result Value Range   Squamous Epithelial / LPF MANY (*) RARE   WBC, UA 11-20  <3 WBC/hpf   RBC / HPF 0-2  <3 RBC/hpf  MRSA PCR SCREENING     Status: None   Collection Time    06/12/13  2:01 AM      Result Value Range   MRSA by PCR NEGATIVE  NEGATIVE   Comment:            The GeneXpert MRSA Assay (FDA     approved for NASAL specimens     only), is one component of a     comprehensive MRSA colonization     surveillance program. It is not     intended to diagnose MRSA     infection nor to guide or     monitor treatment for     MRSA infections.  COMPREHENSIVE METABOLIC PANEL     Status: Abnormal   Collection Time    06/12/13  3:50 AM      Result Value Range   Sodium 142  137 - 147 mEq/L   Potassium 4.0  3.7 - 5.3 mEq/L   Chloride 96  96 - 112 mEq/L   CO2 39 (*) 19 - 32 mEq/L   Glucose, Bld 111 (*) 70 - 99 mg/dL   BUN 19  6 - 23 mg/dL   Creatinine, Ser 0.91   0.50 - 1.10 mg/dL   Calcium 8.7  8.4 - 10.5 mg/dL   Total Protein 5.5 (*) 6.0 - 8.3 g/dL   Albumin 2.7 (*) 3.5 - 5.2 g/dL  AST 21  0 - 37 U/L   ALT 24  0 - 35 U/L   Alkaline Phosphatase 41  39 - 117 U/L   Total Bilirubin 0.2 (*) 0.3 - 1.2 mg/dL   GFR calc non Af Amer 55 (*) >90 mL/min   GFR calc Af Amer 63 (*) >90 mL/min   Comment: (NOTE)     The eGFR has been calculated using the CKD EPI equation.     This calculation has not been validated in all clinical situations.     eGFR's persistently <90 mL/min signify possible Chronic Kidney     Disease.  MAGNESIUM     Status: None   Collection Time    06/12/13  3:50 AM      Result Value Range   Magnesium 1.9  1.5 - 2.5 mg/dL  CBC     Status: Abnormal   Collection Time    06/12/13  3:50 AM      Result Value Range   WBC 13.2 (*) 4.0 - 10.5 K/uL   RBC 3.27 (*) 3.87 - 5.11 MIL/uL   Hemoglobin 10.4 (*) 12.0 - 15.0 g/dL   HCT 31.1 (*) 36.0 - 46.0 %   MCV 95.1  78.0 - 100.0 fL   MCH 31.8  26.0 - 34.0 pg   MCHC 33.4  30.0 - 36.0 g/dL   RDW 12.9  11.5 - 15.5 %   Platelets 164  150 - 400 K/uL   Dg Chest 2 View  06/11/2013   CLINICAL DATA:  Tachycardia and low grade fever.  EXAM: CHEST  2 VIEW  COMPARISON:  06/07/2013  FINDINGS: Heart size is normal. There is no pleural effusion or edema noted. Chronic interstitial coarsening is identified bilaterally. No superimposed airspace consolidation identified. Mild spondylosis noted within the lower thoracic spine.  IMPRESSION: 1. COPD. 2. No pneumonia   Electronically Signed   By: Kerby Moors M.D.   On: 06/11/2013 19:42    Review of Systems  Unable to perform ROS: dementia    Blood pressure 106/41, pulse 77, temperature 98.2 F (36.8 C), temperature source Oral, resp. rate 15, height $RemoveBe'5\' 5"'HZlHOHeUv$  (1.651 m), weight 150 lb 9.2 oz (68.3 kg), SpO2 100.00%. Physical Exam  Nursing note and vitals reviewed. Constitutional: No distress.  Neck: No JVD present.  Cardiovascular: Regular rhythm, S1 normal,  normal heart sounds and intact distal pulses.   Respiratory: She has wheezes.  GI: Soft. Bowel sounds are normal.  Musculoskeletal: She exhibits no edema.  Neurological: She is alert.  Skin: Skin is warm and dry. She is not diaphoretic.    Telemetry shows atrial flutter with 4:1 AV block  Assessment/Plan Tonya [name] is a pleasant 80 year old female with history of COPD and hypertension. She had a recent COPD exacerbation and now presents with new atrial flutter. She has been on a diltiazem drip and is now rate controlled. She would be at high risk for stroke given her age and hypertension. We will need to discuss anticoagulation with her family. Review of her chart shows that she was being seen by palliative care during her last admission.   Atrial flutter -continue diltiazem drip  -can convert to PO -will need to discuss anticoagulation with her family   Hypertension -blood pressure is at goal -continue home medications  COPD -Continue her home medications -respiratory therapy to assist  Dementia -continue home medications  Protein deficient malnutrition -dietary supplements  Smitty Ackerley C 06/12/2013, 5:11 AM

## 2013-06-12 NOTE — Progress Notes (Signed)
Dr Delton SeeNelson notified of HR dropping to mid-upper 50's, SBP 80's. Stopped Cardizem. Pt denies dizziness, chest pain, change in breathing.

## 2013-06-13 DIAGNOSIS — Z66 Do not resuscitate: Secondary | ICD-10-CM | POA: Diagnosis present

## 2013-06-13 MED ORDER — SODIUM CHLORIDE 0.9 % IV SOLN
INTRAVENOUS | Status: DC
  Administered 2013-06-14: 11:00:00 via INTRAVENOUS

## 2013-06-13 MED ORDER — DIGOXIN 0.25 MG/ML IJ SOLN: 0.1250 mg | Freq: Three times a day (TID) | INTRAMUSCULAR | Status: DC

## 2013-06-13 MED ORDER — MORPHINE SULFATE 2 MG/ML IJ SOLN
2.0000 mg | INTRAMUSCULAR | Status: DC | PRN
  Administered 2013-06-14: 2 mg via INTRAVENOUS
  Filled 2013-06-13: qty 1

## 2013-06-13 MED ORDER — DIGOXIN 0.25 MG/ML IJ SOLN
0.2500 mg | Freq: Once | INTRAMUSCULAR | Status: AC
  Administered 2013-06-13: 0.25 mg via INTRAVENOUS
  Filled 2013-06-13 (×2): qty 1

## 2013-06-13 MED ORDER — DIGOXIN 125 MCG PO TABS
0.1250 mg | ORAL_TABLET | Freq: Three times a day (TID) | ORAL | Status: AC
  Administered 2013-06-13 – 2013-06-14 (×2): 0.125 mg via ORAL
  Filled 2013-06-13 (×2): qty 1

## 2013-06-13 MED ORDER — ATROPINE SULFATE 0.1 MG/ML IJ SOLN
INTRAMUSCULAR | Status: AC
  Filled 2013-06-13: qty 10

## 2013-06-13 NOTE — Progress Notes (Signed)
Clinical Social Work Department BRIEF PSYCHOSOCIAL ASSESSMENT 06/13/2013  Patient:  Tonya [name],Tonya [name]     Account Number:  [id number]     Admit date:  06/11/2013  Clinical Social Worker:  Varney BilesANDERSON,[place], LCSWA  Date/Time:  06/13/2013 02:58 PM  Referred by:  Physician  Date Referred:  06/13/2013 Referred for  Residential hospice placement   Other Referral:   Interview type:  Family Other interview type:   Palliative MD called CSW to update on case as well.    PSYCHOSOCIAL DATA Living Status:  FACILITY Admitted from facility:  [facility] [place] Level of care:  Skilled Nursing Facility Primary support name:  Tonya [name] [phone]) Primary support relationship to patient:  CHILD, ADULT Degree of support available:   Good--pt well supported by her daughter, Tonya [name]. [facility] [place] prior to [place].    CURRENT CONCERNS Current Concerns  Post-Acute Placement   Other Concerns:    SOCIAL WORK ASSESSMENT / PLAN CSW received call from palliative MD stating pt is from [facility] [place] but that this was not the optimal discharge plan. Family/pt wanted [facility], but there was not a bed available and pt was discharged to [facility] with the understanding that pt would be added to the waiting list. CSW followed up with [facility] and facility states the case was closed on Friday because pt was deemed in appropriate for residential hospice (was not anticipated that pt had 2 weeks of life or less). CSW asked that facility re-open the case and that palliative deemed pt appropriate. CSW called palliative MD, who will also call facility and advocate for placement at residential hospice. CSW called pt's daughter to update her on CSW and palliative MD efforts--daughter thanked CSW for assistance, and explained that she has been anxious to hear back from [facility] and was wondering  if there is anything she can do to expedite placement process. CSW explained that MD is calling to advocate for pt, and that CSW will send clinicals once palliative MD places note in the chart (tomorrow morning) and that daughter does not have to worry about following up with facility at this time. CSW asked if pt wants information sent to [facility] as well, as [facility] is slow to make offer. Daughter states she would not mind this, giving CSW permission to make referral. [facility], who will evaluate pt and update CSW on bed availability.   Assessment/plan status:  Psychosocial Support/Ongoing Assessment of Needs Other assessment/ plan:   Information/referral to community resources:   Residential hospice referrals.    PATIENT'S/FAMILY'S RESPONSE TO PLAN OF CARE: Very good--pt's daughter expressed gratitude for CSW and palliative MD efforts, and that she is glad to hear progress is being made for residential hospice placement. Daughter friendly and CSW expressed support to pt and actively listened to daughter describe pt/family experience going to [facility], which has been a good experience, but their placement of choice is [facility] and they are hopeful that pt can go here when ready for discharge. CSW following case and continues to provide support/assistance.       Maryclare LabradorJulie Evanell Redlich, MSW, [facility]WA Clinical Social Worker [phone]

## 2013-06-13 NOTE — Care Management Note (Signed)
    Page 1 of 1   06/13/2013     11:13:13 AM   CARE MANAGEMENT NOTE 06/13/2013  Patient:  Tonya [name],Tonya [name]   Account Number:  [id number]  Date Initiated:  06/13/2013  Documentation initiated by:  Junius CreamerWELL,DEBBIE  Subjective/Objective Assessment:   adm [name] at fib [name] rvr     Action/Plan:   from nsg facility,[facility] [place]   Anticipated DC Date:     Anticipated DC Plan:  SKILLED NURSING FACILITY  In-house referral  Clinical Social Worker      DC Planning Services  CM consult      Choice offered to / List presented to:             Status of service:   Medicare Important Message given?   (If response is "NO", the following Medicare IM given date fields will be blank) Date Medicare IM given:   Date Additional Medicare IM given:    Discharge Disposition:  SKILLED NURSING FACILITY  Per UR Regulation:  Reviewed for med. necessity/level of care/duration of stay  If discussed at Long Length of Stay Meetings, dates discussed:    Comments:

## 2013-06-13 NOTE — Progress Notes (Signed)
  DAILY PROGRESS NOTE  Subjective:  Back in atrial flutter with RVR - rate in the upper 150's.  Cardizem was held due to hypotension -she was rate-controlled.  She has advanced COPD and was considered for hospice at her last visit.  Objective:  Temp:  [96.3 F (35.7 C)-97.6 F (36.4 C)] 96.3 F (35.7 C) (01/26 0725) Pulse Rate:  [40-134] 134 (01/26 0725) Resp:  [13-25] 17 (01/26 1000) BP: (80-109)/(30-76) 107/63 mmHg (01/26 1000) SpO2:  [94 %-100 %] 100 % (01/26 1000) Weight change:   Intake/Output from previous day: 01/25 0701 - 01/26 0700 In: 551.3 [P.O.:460; I.V.:91.3] Out: 2000 [Urine:2000]  Intake/Output from this shift: Total I/O In: 240 [P.O.:240] Out: -   Medications: Current Facility-Administered Medications  Medication Dose Route Frequency Provider Last Rate Last Dose  . acetaminophen (TYLENOL) tablet 650 mg  650 mg Oral Q4H PRN Luke Kohan, MD      . ALPRAZolam (XANAX) tablet 1 mg  1 mg Oral Q4H PRN Luke Kohan, MD   1 mg at 06/12/13 1624  . apixaban (ELIQUIS) tablet 2.5 mg  2.5 mg Oral BID Luke Kohan, MD   2.5 mg at 06/13/13 0938  . arformoterol (BROVANA) nebulizer solution 15 mcg  15 mcg Nebulization BID Luke Kohan, MD   15 mcg at 06/13/13 0724  . aspirin EC tablet 81 mg  81 mg Oral Daily Luke Kohan, MD   81 mg at 06/13/13 0938  . budesonide (PULMICORT) nebulizer solution 0.5 mg  0.5 mg Nebulization BID Luke Kohan, MD   0.5 mg at 06/13/13 0724  . clonazePAM (KLONOPIN) tablet 0.5 mg  0.5 mg Oral Daily Luke Kohan, MD   0.5 mg at 06/13/13 0939  . clonazePAM (KLONOPIN) tablet 1 mg  1 mg Oral QHS Luke Kohan, MD   1 mg at 06/12/13 2340  . diltiazem (CARDIZEM) 100 mg in dextrose 5 % 100 mL infusion  5-15 mg/hr Intravenous Titrated Megan E Docherty, MD   5 mg/hr at 06/12/13 1235  . docusate sodium (COLACE) capsule 200 mg  200 mg Oral BID Luke Kohan, MD   200 mg at 06/13/13 0938  . fluticasone (FLONASE) 50 MCG/ACT nasal spray 2 spray  2 spray Each Nare Daily Luke Kohan,  MD   2 spray at 06/13/13 0939  . furosemide (LASIX) tablet 60 mg  60 mg Oral Daily Luke Kohan, MD   60 mg at 06/13/13 0939  . ipratropium (ATROVENT) nebulizer solution 0.5 mg  0.5 mg Nebulization BID Luke Kohan, MD   0.5 mg at 06/13/13 0724  . latanoprost (XALATAN) 0.005 % ophthalmic solution 1 drop  1 drop Right Eye QHS Luke Kohan, MD   1 drop at 06/12/13 2109  . levalbuterol (XOPENEX) nebulizer solution 0.63 mg  0.63 mg Nebulization Q4H PRN Megan E Docherty, MD      . losartan (COZAAR) tablet 50 mg  50 mg Oral Daily Luke Kohan, MD   50 mg at 06/12/13 0919  . montelukast (SINGULAIR) tablet 10 mg  10 mg Oral QHS Luke Kohan, MD   10 mg at 06/12/13 2109  . morphine (MSIR) tablet 15 mg  15 mg Oral Q4H PRN Luke Kohan, MD   15 mg at 06/12/13 1029  . ondansetron (ZOFRAN) injection 4 mg  4 mg Intravenous Q6H PRN Luke Kohan, MD      . pantoprazole (PROTONIX) EC tablet 40 mg  40 mg Oral Daily Luke Kohan, MD   40 mg at 06/13/13 0944  . polyethylene   glycol (MIRALAX / GLYCOLAX) packet 17 g  17 g Oral Daily Javier Docker, MD   17 g at 06/13/13 [phone]  . pravastatin (PRAVACHOL) tablet 40 mg  40 mg Oral q1800 Candee Furbish, MD   40 mg at 06/12/13 1846  . sennosides-docusate sodium (SENOKOT-S) 8.6-50 MG tablet 1 tablet  1 tablet Oral Daily PRN Javier Docker, MD        Physical Exam: General appearance: alert Neck: no carotid bruit and no JVD Lungs: diminished breath sounds bilaterally Heart: regular tachycardia Abdomen: soft, non-tender; bowel sounds normal; no masses,  no organomegaly Extremities: extremities normal, atraumatic, no cyanosis or edema Pulses: 2+ and symmetric Skin: pale, warm, dry Neurologic: Mental status: Alert, oriented, thought content appropriate Psych: Slow processing, may be due to hearing loss  Lab Results: Results for orders placed during the hospital encounter of 06/11/13 (from the past 48 hour(s))  CBC WITH DIFFERENTIAL     Status: Abnormal   Collection Time    06/11/13  7:05 PM       Result Value Range   WBC 10.1  4.0 - 10.5 K/uL   RBC 3.37 (*) 3.87 - 5.11 MIL/uL   Hemoglobin 10.6 (*) 12.0 - 15.0 g/dL   HCT 32.1 (*) 36.0 - 46.0 %   MCV 95.3  78.0 - 100.0 fL   MCH 31.5  26.0 - 34.0 pg   MCHC 33.0  30.0 - 36.0 g/dL   RDW 12.8  11.5 - 15.5 %   Platelets 176  150 - 400 K/uL   Neutrophils Relative % 90 (*) 43 - 77 %   Neutro Abs 9.0 (*) 1.7 - 7.7 K/uL   Lymphocytes Relative 5 (*) 12 - 46 %   Lymphs Abs 0.5 (*) 0.7 - 4.0 K/uL   Monocytes Relative 6  3 - 12 %   Monocytes Absolute 0.6  0.1 - 1.0 K/uL   Eosinophils Relative 0  0 - 5 %   Eosinophils Absolute 0.0  0.0 - 0.7 K/uL   Basophils Relative 0  0 - 1 %   Basophils Absolute 0.0  0.0 - 0.1 K/uL  BASIC METABOLIC PANEL     Status: Abnormal   Collection Time    06/11/13  7:05 PM      Result Value Range   Sodium 136 (*) 137 - 147 mEq/L   Potassium 4.3  3.7 - 5.3 mEq/L   Chloride 90 (*) 96 - 112 mEq/L   CO2 40 (*) 19 - 32 mEq/L   Comment: CRITICAL RESULT CALLED TO, READ BACK BY AND VERIFIED WITH:     ANNA SHELL,RN [id number] @ 1953 BY J SCOTTON   Glucose, Bld 150 (*) 70 - 99 mg/dL   BUN 20  6 - 23 mg/dL   Creatinine, Ser 0.79  0.50 - 1.10 mg/dL   Calcium 8.9  8.4 - 10.5 mg/dL   GFR calc non Af Amer 72 (*) >90 mL/min   GFR calc Af Amer 84 (*) >90 mL/min   Comment: (NOTE)     The eGFR has been calculated using the CKD EPI equation.     This calculation has not been validated in all clinical situations.     eGFR's persistently <90 mL/min signify possible Chronic Kidney     Disease.  TROPONIN I     Status: None   Collection Time    06/11/13  7:05 PM      Result Value Range   Troponin I <0.30  <0.30 ng/mL  Comment:            Due to the release kinetics of cTnI,     a negative result within the first hours     of the onset of symptoms does not rule out     myocardial infarction with certainty.     If myocardial infarction is still suspected,     repeat the test at appropriate intervals.  URINALYSIS, ROUTINE W  REFLEX MICROSCOPIC     Status: Abnormal   Collection Time    06/11/13  8:40 PM      Result Value Range   Color, Urine YELLOW  YELLOW   APPearance CLOUDY (*) CLEAR   Specific Gravity, Urine 1.009  1.005 - 1.030   pH 8.0  5.0 - 8.0   Glucose, UA NEGATIVE  NEGATIVE mg/dL   Hgb urine dipstick NEGATIVE  NEGATIVE   Bilirubin Urine NEGATIVE  NEGATIVE   Ketones, ur NEGATIVE  NEGATIVE mg/dL   Protein, ur NEGATIVE  NEGATIVE mg/dL   Urobilinogen, UA 0.2  0.0 - 1.0 mg/dL   Nitrite NEGATIVE  NEGATIVE   Leukocytes, UA LARGE (*) NEGATIVE  URINE MICROSCOPIC-ADD ON     Status: Abnormal   Collection Time    06/11/13  8:40 PM      Result Value Range   Squamous Epithelial / LPF MANY (*) RARE   WBC, UA 11-20  <3 WBC/hpf   RBC / HPF 0-2  <3 RBC/hpf  MRSA PCR SCREENING     Status: None   Collection Time    06/12/13  2:01 AM      Result Value Range   MRSA by PCR NEGATIVE  NEGATIVE   Comment:            The GeneXpert MRSA Assay (FDA     approved for NASAL specimens     only), is one component of a     comprehensive MRSA colonization     surveillance program. It is not     intended to diagnose MRSA     infection nor to guide or     monitor treatment for     MRSA infections.  TSH     Status: None   Collection Time    06/12/13  3:50 AM      Result Value Range   TSH 0.552  0.350 - 4.500 uIU/mL   Comment: Performed at [facility]  T4, FREE     Status: None   Collection Time    06/12/13  3:50 AM      Result Value Range   Free T4 1.45  0.80 - 1.80 ng/dL   Comment: Performed at [facility]     Status: Abnormal   Collection Time    06/12/13  3:50 AM      Result Value Range   Sodium 142  137 - 147 mEq/L   Potassium 4.0  3.7 - 5.3 mEq/L   Chloride 96  96 - 112 mEq/L   CO2 39 (*) 19 - 32 mEq/L   Glucose, Bld 111 (*) 70 - 99 mg/dL   BUN 19  6 - 23 mg/dL   Creatinine, Ser 0.91  0.50 - 1.10 mg/dL   Calcium 8.7  8.4 - 10.5 mg/dL   Total Protein  5.5 (*) 6.0 - 8.3 g/dL   Albumin 2.7 (*) 3.5 - 5.2 g/dL   AST 21  0 - 37 U/L   ALT 24  0 -  35 U/L   Alkaline Phosphatase 41  39 - 117 U/L   Total Bilirubin 0.2 (*) 0.3 - 1.2 mg/dL   GFR calc non Af Amer 55 (*) >90 mL/min   GFR calc Af Amer 63 (*) >90 mL/min   Comment: (NOTE)     The eGFR has been calculated using the CKD EPI equation.     This calculation has not been validated in all clinical situations.     eGFR's persistently <90 mL/min signify possible Chronic Kidney     Disease.  MAGNESIUM     Status: None   Collection Time    06/12/13  3:50 AM      Result Value Range   Magnesium 1.9  1.5 - 2.5 mg/dL  PRO B NATRIURETIC PEPTIDE     Status: Abnormal   Collection Time    06/12/13  3:50 AM      Result Value Range   Pro B Natriuretic peptide (BNP) 3418.0 (*) 0 - 450 pg/mL  CBC     Status: Abnormal   Collection Time    06/12/13  3:50 AM      Result Value Range   WBC 13.2 (*) 4.0 - 10.5 K/uL   RBC 3.27 (*) 3.87 - 5.11 MIL/uL   Hemoglobin 10.4 (*) 12.0 - 15.0 g/dL   HCT 31.1 (*) 36.0 - 46.0 %   MCV 95.1  78.0 - 100.0 fL   MCH 31.8  26.0 - 34.0 pg   MCHC 33.4  30.0 - 36.0 g/dL   RDW 12.9  11.5 - 15.5 %   Platelets 164  150 - 400 K/uL    Imaging: Dg Chest 2 View  06/11/2013   CLINICAL DATA:  Tachycardia and low grade fever.  EXAM: CHEST  2 VIEW  COMPARISON:  06/07/2013  FINDINGS: Heart size is normal. There is no pleural effusion or edema noted. Chronic interstitial coarsening is identified bilaterally. No superimposed airspace consolidation identified. Mild spondylosis noted within the lower thoracic spine.  IMPRESSION: 1. COPD. 2. No pneumonia   Electronically Signed   By: Taylor  Stroud M.D.   On: 06/11/2013 19:42    Assessment:  Active Problems:   Advanced COPD   Atrial flutter with rapid ventricular response   DNR (do not resuscitate)   Plan:  1. Mrs. Fulgham has advanced COPD with recurrent atrial flutter and RVR. She has indicated she wishes to be DNR/DNI and  was seen by Dr. Taylor for plans regarding hospice at her most recent hospitalization last week. These were never finalized. She is high risk for complications with TEE/Cardioversion and i'm doubtful it would be successful.  She is not a good amiodarone candidate due to her lung disease. We have restarted cardizem today and I will digitalize her as well.  Renal function, fortunately, is ok.   DNR/DNI  Time Spent Directly with Patient:  30 minutes  Length of Stay:  LOS: 2 days    C. , MD, FACC Attending Cardiologist [facility]  , C 06/13/2013, 10:20 AM     

## 2013-06-13 NOTE — Progress Notes (Signed)
Chaplain met with pt and family.  Chaplain spoke with daughter for some time regarding mother's condition and plan of care.  Chaplain wanted to ensure that pt was on catholic census, which she was.

## 2013-06-14 LAB — URINE CULTURE: Colony Count: 75000

## 2013-06-14 MED ORDER — MORPHINE SULFATE (CONCENTRATE) 20 MG/ML PO SOLN: 5.0000 mg | ORAL | Status: AC | PRN

## 2013-06-14 MED ORDER — DIGOXIN 125 MCG PO TABS
0.1250 mg | ORAL_TABLET | Freq: Every day | ORAL | Status: DC
  Administered 2013-06-14: 0.125 mg via ORAL
  Filled 2013-06-14: qty 1

## 2013-06-14 MED ORDER — APIXABAN 2.5 MG PO TABS: 2.5000 mg | ORAL_TABLET | Freq: Two times a day (BID) | ORAL | Status: AC

## 2013-06-14 MED ORDER — DIGOXIN 125 MCG PO TABS: 0.1250 mg | ORAL_TABLET | Freq: Every day | ORAL | Status: AC

## 2013-06-14 MED ORDER — ACETAMINOPHEN 325 MG PO TABS: 650.0000 mg | ORAL_TABLET | ORAL | Status: AC | PRN

## 2013-06-14 MED ORDER — DILTIAZEM HCL ER COATED BEADS 120 MG PO TB24: 120.0000 mg | ORAL_TABLET | Freq: Every day | ORAL | Status: AC

## 2013-06-14 MED ORDER — DILTIAZEM HCL ER COATED BEADS 120 MG PO TB24
120.0000 mg | ORAL_TABLET | Freq: Every day | ORAL | Status: DC
  Administered 2013-06-14: 120 mg via ORAL
  Filled 2013-06-14: qty 1

## 2013-06-14 MED ORDER — LEVALBUTEROL HCL 0.63 MG/3ML IN NEBU: 0.6300 mg | INHALATION_SOLUTION | RESPIRATORY_TRACT | Status: AC | PRN

## 2013-06-14 NOTE — Progress Notes (Signed)
Patient ZO:[id number]:Tonya [name]      DOB: Jun 29, 1922      UJW:[id number]  Case discussed with Director at [facility].  They did not have bed available when the patient was at [facility].  Bed now availale and my prognosis of days to week stands from previous admission.  The discharging team would please continue her xanax and send a perscription for Roxanol 20 mg/ml 5 mg SL q 1-2 hrs prn dispense 30 ml at time of discharge in place of the MSIR tablets that can be difficult to swallow.   Thank you for caring for our mutual patient I have updated her daughter by phone.   Ines Warf L. Ladona Ridgelaylor, MD MBA The Palliative Medicine Team at [facility] Team Phone: [phone] Pager: [phone]

## 2013-06-14 NOTE — Discharge Summary (Signed)
Patient ID: Tonya [name],  MRN: [id number], DOB/AGE: 78/01/1923 78 y.o.  Admit date: 06/11/2013 Discharge date: 06/14/2013  Primary Care Provider: Dr Oneta RackMcKeown Primary Cardiologist:  Dr Rennis GoldenHilty  Discharge Diagnoses Active Problems:   Advanced COPD   Atrial flutter with rapid ventricular response   DNR (do not resuscitate)    Hospital Course:   Tonya [name] is an 80 year old female with a past medical history of dementia, COPD and hypertension. She was recently discharged [facility] after an admission for COPD exacerbation. She was brought back to the ED because of tachycardia 06/11/13. In the ED she was found to have atrial flutter with 2:1 AV block and cardiology was called to admit. Marland Kitchen. She was started on a diltiazem drip which has controlled her rate. Tonya [name] has advanced COPD with recurrent atrial flutter and RVR. She has indicated she wishes to be DNR/DNI was seen by Dr. Ladona Ridgelaylor for plans regarding hospice. The pt received an open [facility] and will be transferred today. She has a prognosis of days to a week.     Discharge Vitals:  Blood pressure 125/47, pulse 99, temperature 98.6 F (37 C), temperature source Oral, resp. rate 13, height 5\' 5"  (1.651 m), weight 150 lb 9.2 oz (68.3 kg), SpO2 96.00%.    Labs: No results found for this or any previous visit (from the past 48 hour(s)).  Disposition:  Follow-up Information   Follow up with Nadean CorwinMCKEOWN,WILLIAM DAVID, MD. (As needed)    Specialty:  Internal Medicine   Contact information:   [address] [place] [address] [phone]       Discharge Medications:    Medication List    STOP taking these medications       albuterol (2.5 MG/3ML) 0.083% nebulizer solution  Commonly known as:  PROVENTIL     morphine 15 MG tablet  Commonly known as:  MSIR  Replaced by:  morphine 20 MG/ML concentrated solution     predniSONE 20 MG tablet  Commonly known as:  DELTASONE      sennosides-docusate sodium 8.6-50 MG tablet  Commonly known as:  SENOKOT-S      TAKE these medications       acetaminophen 325 MG tablet  Commonly known as:  TYLENOL  Take 2 tablets (650 mg total) by mouth every 4 (four) hours as needed for headache or mild pain.     ALPRAZolam 1 MG tablet  Commonly known as:  XANAX  Take 1 tablet (1 mg total) by mouth every 4 (four) hours as needed for anxiety or sleep.     apixaban 2.5 MG Tabs tablet  Commonly known as:  ELIQUIS  Take 1 tablet (2.5 mg total) by mouth 2 (two) times daily.     arformoterol 15 MCG/2ML Nebu  Commonly known as:  BROVANA  Take 2 mLs (15 mcg total) by nebulization 2 (two) times daily.     aspirin EC 81 MG tablet  Take 81 mg by mouth daily.     beta carotene w/minerals tablet  Take 1 tablet by mouth daily.     budesonide 0.5 MG/2ML nebulizer solution  Commonly known as:  PULMICORT  Take 2 mLs (0.5 mg total) by nebulization 2 (two) times daily.     cholecalciferol 1000 UNITS tablet  Commonly known as:  VITAMIN D  Take 1,000 Units by mouth daily.     clonazePAM 0.5 MG tablet  Commonly known as:  KLONOPIN  Take 1 tablet (  0.5 mg total) by mouth daily.     clonazePAM 1 MG tablet  Commonly known as:  KLONOPIN  Take 1 tablet (1 mg total) by mouth at bedtime.     digoxin 0.125 MG tablet  Commonly known as:  LANOXIN  Take 1 tablet (0.125 mg total) by mouth daily.     diltiazem 120 MG 24 hr tablet  Commonly known as:  CARDIZEM LA  Take 1 tablet (120 mg total) by mouth daily.     DSS 100 MG Caps  Take 200 mg by mouth 2 (two) times daily.     ferrous sulfate 325 (65 FE) MG tablet  Take 325 mg by mouth daily with breakfast.     fluticasone 50 MCG/ACT nasal spray  Commonly known as:  FLONASE  Place 2 sprays into both nostrils daily.     furosemide 20 MG tablet  Commonly known as:  LASIX  Take 3 tablets (60 mg total) by mouth daily.     ipratropium 0.02 % nebulizer solution  Commonly known as:  ATROVENT   Take 2.5 mLs (0.5 mg total) by nebulization 2 (two) times daily.     latanoprost 0.005 % ophthalmic solution  Commonly known as:  XALATAN  Place 1 drop into the right eye at bedtime.     levalbuterol 0.63 MG/3ML nebulizer solution  Commonly known as:  XOPENEX  Take 3 mLs (0.63 mg total) by nebulization every 4 (four) hours as needed for wheezing or shortness of breath.     losartan 50 MG tablet  Commonly known as:  COZAAR  Take 50 mg by mouth daily.     morphine 20 MG/ML concentrated solution  Commonly known as:  ROXANOL  Place 0.25 mLs (5 mg total) under the tongue every 2 (two) hours as needed for severe pain.     pantoprazole 40 MG tablet  Commonly known as:  PROTONIX  Take 1 tablet (40 mg total) by mouth daily.     polyethylene glycol packet  Commonly known as:  MIRALAX / GLYCOLAX  Take 17 g by mouth daily.     pravastatin 40 MG tablet  Commonly known as:  PRAVACHOL  Take 40 mg by mouth at bedtime.     zafirlukast 20 MG tablet  Commonly known as:  ACCOLATE  Take 20 mg by mouth 2 (two) times daily.         Duration of Discharge Encounter: Greater than 30 minutes including physician time.  Jolene Provost PA-C 06/14/2013 1:04 PM

## 2013-06-14 NOTE — Progress Notes (Addendum)
[facility] requesting pick-up from hospital at 5:30pm so she can arrive around 6pm. CSW to arrange this after informing pt's RN. CSW called RN and is waiting for return call. CSW e-mailed discharge summary to facility rep because fax numbers were busy when CSW attempted to send summary. Facility aware to look out for e-mail. CSW will place discharge packet with signed DNR form and discharge summary on pt's chart. No additional information is needed in packet.  Addendum: CSW placed discharge packet on pt's chart. CSW informed RN of plan for pickup to go to hospice home at 5:30pm. CSW called pt's daughter to inform her of timeline for discharge. Pt's daughter thanked CSW and expressed that she is grateful pt will go to residential hospice, as this was the original discharge option of choice. CSW signing off.  Maryclare LabradorJulie Dequandre Cordova, MSW, [facility] Clinical Social Worker [phone]

## 2013-06-14 NOTE — Progress Notes (Signed)
MD working on discharge summary and filling out DNR form. CSW to discharge pt to  residential hospice today.    Maryclare LabradorJulie Aurore Redinger, MSW, [facility] Clinical Social Worker [phone]

## 2013-06-14 NOTE — Progress Notes (Signed)
[name]      DOB: 07/20/1922      AOZ:[id number]  Patient sleeping soundly , just given morphine for dyspnea.  Daughter not at the bedside.  I have spoken with hosp[facility] of [place] .  The director will be available to me in the am .  I will review the case with her.  Daughter not at the bedside will up date as soon as possible..  Reviewed comfort needs with nurse .  Brynley Cuddeback L. Ladona Ridgelaylor, MD MBA The Palliative Medicine Team at [facility] Team Phone: [phone] Pager: [phone]

## 2013-06-14 NOTE — Progress Notes (Signed)
CS[facility] from [facility] stating there are [facility] beds today. Palliative MD to confer with [facility] of [place] to advocate for placement. CSW following case.    Maryclare LabradorJulie Stanislaus Kaltenbach, MSW, [facility] Clinical Social Worker [phone]

## 2013-06-14 NOTE — Progress Notes (Signed)
Patient transported to [facility] via [facility] on O[facility].  Family at bedside.  Report called to [place] at [facility].  Bilateral PIVs d/c'd per protocol and per Elnita Maxwellheryl.  Patient awake, alert and oriented prior to discharge.  Breathing treatment given prior to discharge and tolerated well by patient.  Transport information given to [facility] transporters.

## 2013-06-14 NOTE — Progress Notes (Addendum)
CSW received call from [facility]--they are able to offer pt a bed today. CSW called palliative MD, who explained she will call attending to make recommendations for medication, which have not changed, and inform of bed offer at residential hospice. CSW is following for discharge. CSW to call Tory EmeraldSandra Gibson in admissions when pt is discharged ([phone]) and will compile discharge packet for pt to go to facility.  Addendum: CSW received call from admissions with [facility] requesting H&P, progress note, and palliative note. CSW has faxed these to facility.  Maryclare LabradorJulie Otisha Spickler, MSW, [facility] Clinical Social Worker [phone]

## 2013-06-14 NOTE — Progress Notes (Signed)
DAILY PROGRESS NOTE  Subjective:  Atrial flutter today with controlled ventricular response at 80 after digoxin loading yesterday. Cardizem was held due to hypotension, but then I restarted it.  She has advanced COPD and was considered for hospice at her last visit.  Dr. Ladona Ridgelaylor saw yesterday and is reviewing options for hospice in [place].  Objective:  Temp:  [97.6 F (36.4 C)-98.7 F (37.1 C)] 98.6 F (37 C) (01/27 0800) Pulse Rate:  [99-108] 99 (01/27 0519) Resp:  [13-32] 14 (01/27 0800) BP: (89-158)/(33-63) 135/48 mmHg (01/27 0800) SpO2:  [95 %-100 %] 99 % (01/27 0800) Weight change:   Intake/Output from previous day: 01/26 0701 - 01/27 0700 In: 1355 [P.O.:1140; I.V.:215] Out: 1850 [Urine:1850]  Intake/Output from this shift: Total I/O In: 370 [P.O.:350; I.V.:20] Out: -   Medications: Current Facility-Administered Medications  Medication Dose Route Frequency Provider Last Rate Last Dose  . 0.9 %  sodium chloride infusion   Intravenous Continuous Donato SchultzMark Skains, MD 5 mL/hr at 06/13/13 2200    . acetaminophen (TYLENOL) tablet 650 mg  650 mg Oral Q4H PRN Anne NgLuke Kohan, MD      . ALPRAZolam Prudy Feeler(XANAX) tablet 1 mg  1 mg Oral Q4H PRN Anne NgLuke Kohan, MD   1 mg at 06/12/13 1624  . apixaban (ELIQUIS) tablet 2.5 mg  2.5 mg Oral BID Anne NgLuke Kohan, MD   2.5 mg at 06/13/13 2217  . arformoterol (BROVANA) nebulizer solution 15 mcg  15 mcg Nebulization BID Anne NgLuke Kohan, MD   15 mcg at 06/14/13 0756  . aspirin EC tablet 81 mg  81 mg Oral Daily Anne NgLuke Kohan, MD   81 mg at 06/13/13 [id number]  . budesonide (PULMICORT) nebulizer solution 0.5 mg  0.5 mg Nebulization BID Anne NgLuke Kohan, MD   0.5 mg at 06/14/13 0756  . clonazePAM (KLONOPIN) tablet 0.5 mg  0.5 mg Oral Daily Anne NgLuke Kohan, MD   0.5 mg at 06/13/13 0939  . clonazePAM (KLONOPIN) tablet 1 mg  1 mg Oral QHS Anne NgLuke Kohan, MD   1 mg at 06/13/13 2218  . diltiazem (CARDIZEM) 100 mg in dextrose 5 % 100 mL infusion  5-15 mg/hr Intravenous Titrated Shanna CiscoMegan E Docherty, MD 5  mL/hr at 06/13/13 1738 5 mg/hr at 06/13/13 1738  . docusate sodium (COLACE) capsule 200 mg  200 mg Oral BID Anne NgLuke Kohan, MD   200 mg at 06/13/13 2217  . fluticasone (FLONASE) 50 MCG/ACT nasal spray 2 spray  2 spray Each Nare Daily Anne NgLuke Kohan, MD   2 spray at 06/13/13 0939  . furosemide (LASIX) tablet 60 mg  60 mg Oral Daily Anne NgLuke Kohan, MD   60 mg at 06/13/13 0939  . ipratropium (ATROVENT) nebulizer solution 0.5 mg  0.5 mg Nebulization BID Anne NgLuke Kohan, MD   0.5 mg at 06/14/13 0755  . latanoprost (XALATAN) 0.005 % ophthalmic solution 1 drop  1 drop Right Eye QHS Anne NgLuke Kohan, MD   1 drop at 06/13/13 2218  . levalbuterol (XOPENEX) nebulizer solution 0.63 mg  0.63 mg Nebulization Q4H PRN Shanna CiscoMegan E Docherty, MD   0.63 mg at 06/14/13 0311  . losartan (COZAAR) tablet 50 mg  50 mg Oral Daily Anne NgLuke Kohan, MD   50 mg at 06/12/13 0919  . montelukast (SINGULAIR) tablet 10 mg  10 mg Oral QHS Anne NgLuke Kohan, MD   10 mg at 06/13/13 2217  . morphine (MSIR) tablet 15 mg  15 mg Oral Q4H PRN Anne NgLuke Kohan, MD   15 mg at 06/13/13 1806  .  morphine 2 MG/ML injection 2 mg  2 mg Intravenous Q2H PRN Joline Salt Barrett, PA-C   2 mg at 06/14/13 0239  . ondansetron (ZOFRAN) injection 4 mg  4 mg Intravenous Q6H PRN Anne Ng, MD      . pantoprazole (PROTONIX) EC tablet 40 mg  40 mg Oral Daily Anne Ng, MD   40 mg at 06/13/13 0944  . polyethylene glycol (MIRALAX / GLYCOLAX) packet 17 g  17 g Oral Daily Anne Ng, MD   17 g at 06/13/13 [phone]  . pravastatin (PRAVACHOL) tablet 40 mg  40 mg Oral q1800 Donato Schultz, MD   40 mg at 06/13/13 1806  . sennosides-docusate sodium (SENOKOT-S) 8.6-50 MG tablet 1 tablet  1 tablet Oral Daily PRN Anne Ng, MD        Physical Exam: General appearance: alert Neck: no carotid bruit and no JVD Lungs: diminished breath sounds bilaterally Heart: regular tachycardia Abdomen: soft, non-tender; bowel sounds normal; no masses,  no organomegaly Extremities: extremities normal, atraumatic, no cyanosis or  edema Pulses: 2+ and symmetric Skin: pale, warm, dry Neurologic: Mental status: Alert, oriented, thought content appropriate Psych: Slow processing, may be due to hearing loss  Lab Results: No results found for this or any previous visit (from the past 48 hour(s)).  Imaging: No results found.  Assessment:  Active Problems:   Advanced COPD   Atrial flutter with rapid ventricular response   DNR (do not resuscitate)   Plan:  1. Tonya [name] has advanced COPD with recurrent atrial flutter - which is now better rate-controlled on digoxin and diltiazem. She has indicated she wishes to be DNR/DNI and was seen by Dr. Ladona Ridgel for plans regarding hospice. Hopefully we can arrange hospice placement in [place] today.  Will switch cardizem over to Cardizem LA 120 mg PO today.  Continue digoxin for rate control.  Transfer to telemetry floor.  Hopeful d/c to hospice tomorrow with Dr. Lubertha Basque assistance.  DNR/DNI  Time Spent Directly with Patient:  15 minutes   Length of Stay:  LOS: 3 days   Tonya Nose, MD, [facility] Attending Cardiologist [facility]  HILTY,Kenneth C 06/14/2013, 9:58 AM

## 2013-06-14 NOTE — Consult Note (Signed)
[facility] Liaison: [facility]ceived [facility] from [facility]. Will update CSW if availability changes. Thank you. Forrestine Himva Brewster Wolters LCSW [phone]

## 2013-06-24 ENCOUNTER — Telehealth: Payer: Self-pay

## 2013-06-24 NOTE — Telephone Encounter (Signed)
Patient past away @ [facility] in [place] per [facility] in [place] °

## 2013-07-17 DEATH — deceased

## 2013-07-28 ENCOUNTER — Encounter: Payer: Self-pay | Admitting: Internal Medicine
